# Patient Record
Sex: Female | Born: 1960
Health system: Southern US, Community
[De-identification: ages and names within clinical notes are randomized; demographics above are authoritative.]

## PROBLEM LIST (undated history)

## (undated) DIAGNOSIS — M1990 Unspecified osteoarthritis, unspecified site: Secondary | ICD-10-CM

## (undated) HISTORY — PX: JOINT REPLACEMENT: SHX530

## (undated) HISTORY — PX: COLONOSCOPY: SHX174

## (undated) HISTORY — PX: REPLACEMENT TOTAL KNEE: SUR1224

## (undated) HISTORY — PX: BUNIONECTOMY: SHX129

## (undated) HISTORY — DX: Unspecified osteoarthritis, unspecified site: M19.90

## (undated) HISTORY — PX: REDUCTION MAMMAPLASTY: SUR839

---

## 2018-07-18 DIAGNOSIS — R110 Nausea: Secondary | ICD-10-CM | POA: Diagnosis not present

## 2018-07-18 DIAGNOSIS — Z20828 Contact with and (suspected) exposure to other viral communicable diseases: Secondary | ICD-10-CM | POA: Diagnosis not present

## 2018-07-18 DIAGNOSIS — B349 Viral infection, unspecified: Secondary | ICD-10-CM | POA: Diagnosis not present

## 2018-07-18 DIAGNOSIS — R52 Pain, unspecified: Secondary | ICD-10-CM | POA: Diagnosis not present

## 2018-07-18 DIAGNOSIS — R197 Diarrhea, unspecified: Secondary | ICD-10-CM | POA: Diagnosis not present

## 2018-08-03 DIAGNOSIS — U071 COVID-19: Secondary | ICD-10-CM | POA: Diagnosis not present

## 2018-12-13 DIAGNOSIS — M25562 Pain in left knee: Secondary | ICD-10-CM | POA: Diagnosis not present

## 2019-01-29 ENCOUNTER — Encounter: Payer: Self-pay | Admitting: Osteopathic Medicine

## 2019-01-29 ENCOUNTER — Ambulatory Visit (INDEPENDENT_AMBULATORY_CARE_PROVIDER_SITE_OTHER): Payer: BC Managed Care – PPO | Admitting: Osteopathic Medicine

## 2019-01-29 VITALS — BP 115/74 | HR 76 | Temp 98.2°F | Ht 71.0 in | Wt 216.1 lb

## 2019-01-29 DIAGNOSIS — Z0000 Encounter for general adult medical examination without abnormal findings: Secondary | ICD-10-CM

## 2019-01-29 NOTE — Progress Notes (Signed)
HPI: Sara Leblanc is a 59 y.o. female who  has no past medical history on file.  she presents to [facility] today, 01/29/19,  for chief complaint of: New to establish care.  Very pleasant new patient here to establish care.  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  They are both now retired.  Patient has no significant medical problems, takes a variety of over-the-counter supplements.  Has been sometime since last lab work.  Reports history of right knee replacement, left knee arthritis is becoming an issue but she is already set up with orthopedics.   Reports that she might be due for colonoscopy, mammogram, and Pap smear but she would like for me to review records to double check before she schedules anything.  She reports up-to-date with flu shot, tetanus shot, recent shingles shot and has follow-up for second shot, pneumonia shot done as well.  Past medical, surgical, social and family history reviewed:  There are no problems to display for this patient.   Past Surgical History:  Procedure Laterality Date  . REPLACEMENT TOTAL KNEE Right     Social History   Tobacco Use  . Smoking status: Never Smoker  . Smokeless tobacco: Never Used  Substance Use Topics  . Alcohol use: Not Currently    History reviewed. No pertinent family history.   Current medication list and allergy/intolerance information reviewed:    Current Outpatient Medications  Medication Sig Dispense Refill  . Ascorbic Acid (VITAMIN C) 1000 MG tablet Take 1,000 mg by mouth daily.    . Calcium-Vitamin D (CALTRATE 600 PLUS-VIT D PO) Take by mouth.    . Cholecalciferol (VITAMIN D3) 125 MCG (5000 UT) CAPS Take by mouth.    . Cyanocobalamin (B-12) 1000 MCG CAPS Take by mouth.    . Multiple Vitamins-Minerals (CENTRUM SILVER 50+WOMEN PO) Take by mouth.    . Omega 3-6-9 Fatty  Acids (OMEGA-3-6-9 PO) Take by mouth.    . Zinc 25 MG TABS Take by mouth.     No current facility-administered medications for this visit.    No Known Allergies    Review of Systems:  Constitutional:  No  fever, no chills, No recent illness, No unintentional weight changes. No significant fatigue.   HEENT: No  headache, no vision change, no hearing change, No sore throat, No  sinus pressure  Cardiac: No  chest pain, No  pressure, No palpitations, No  Orthopnea  Respiratory:  No  shortness of breath. No  Cough  Gastrointestinal: No  abdominal pain, No  nausea, No  vomiting,  No  blood in stool, No  diarrhea, No  constipation   Musculoskeletal: No new myalgia/arthralgia  Skin: No  Rash, No other wounds/concerning lesions  Genitourinary: No  incontinence, No  abnormal genital bleeding, No abnormal genital discharge  Hem/Onc: No  easy bruising/bleeding, No  abnormal lymph node  Endocrine: No cold intolerance,  No heat intolerance. No polyuria/polydipsia/polyphagia   Neurologic: No  weakness, No  dizziness, No  slurred speech/focal weakness/facial droop  Psychiatric: No  concerns with depression, No  concerns with anxiety, No sleep problems, No mood problems  Exam:  BP 115/74 (BP Location: Left Arm, Patient Position: Sitting, Cuff Size: Normal)   Pulse 76   Temp 98.2 F (36.8 C) (Oral)   Ht 5\' 11"  (1.803 m)   Wt 216 lb 1.9 oz (98 kg)  BMI 30.14 kg/m   Constitutional: VS see above. General Appearance: alert, well-developed, well-nourished, NAD  Eyes: Normal lids and conjunctive, non-icteric sclera  Neck: No masses, trachea midline. No thyroid enlargement. No tenderness/mass appreciated. No lymphadenopathy  Respiratory: Normal respiratory effort. no wheeze, no rhonchi, no rales  Cardiovascular: S1/S2 normal, no murmur, no rub/gallop auscultated. RRR. No lower extremity edema.   Gastrointestinal: Nontender, no masses. No hepatomegaly, no splenomegaly. No hernia  appreciated. Bowel sounds normal. Rectal exam deferred.   Musculoskeletal: Gait normal. No clubbing/cyanosis of digits.   Neurological: Normal balance/coordination. No tremor. No cranial nerve deficit on limited exam. Motor and sensation intact and symmetric. Cerebellar reflexes intact.   Skin: warm, dry, intact. No rash/ulcer. No concerning nevi or subq nodules on limited exam.    Psychiatric: Normal judgment/insight. Normal mood and affect. Oriented x3.    No results found for this or any previous visit (from the past 72 hour(s)).  No results found.   ASSESSMENT/PLAN: The encounter diagnosis was Encounter for medical examination to establish care.   No complaints  Patient would like me to look over her records before she decides what she might be due for/willing to get soon.  Sounds like she might be due for colonoscopy, may be due for mammogram later this year.  No orders of the defined types were placed in this encounter.   No orders of the defined types were placed in this encounter.   Patient Instructions  I will look over records and call you w/ plan!        Visit summary with medication list and pertinent instructions was printed for patient to review. All questions at time of visit were answered - patient instructed to contact office with any additional concerns or updates. ER/RTC precautions were reviewed with the patient.   Note: Total time spent 30 minutes, greater than 50% of the visit was spent face-to-face counseling and coordinating care for the above diagnoses listed in assessment/plan.   Please note: voice recognition software was used to produce this document, and typos may escape review. Please contact Dr. Lyn Hollingshead for any needed clarifications.     Follow-up plan: Return in about 1 year (around 01/29/2020) for [place] (call week prior to visit for lab orders).

## 2019-01-29 NOTE — Patient Instructions (Signed)
I will look over records and call you w/ plan!

## 2019-03-17 ENCOUNTER — Telehealth: Payer: Self-pay

## 2019-03-17 NOTE — Telephone Encounter (Signed)
Non-urgent msg:  Pt called requesting to verify whether provider has received her medical records from [place]. She wanted to know whether she is due for a mammogram as well. Pls advise, thanks.

## 2019-03-18 NOTE — Telephone Encounter (Signed)
I don't think we received these records - can we request again? Mammogram recommended annually either way if she thinks she's due we can get it done

## 2019-03-19 NOTE — Telephone Encounter (Signed)
LVM for patient to callback with contact information for med recs from [place].  Or patient can contact prior provider directly to have med recs sent.

## 2019-03-19 NOTE — Telephone Encounter (Signed)
Contacted the prior Provider for patient's records.   Provider stated that documentation was incomplete for ROI.   Advised the patient.   Pt coming to the office to complete documentation for ROI on 03/20/19.  I have transferred the patient's information to the new form and highlighted all places for signature.

## 2019-04-01 ENCOUNTER — Telehealth: Payer: Self-pay

## 2019-04-01 DIAGNOSIS — Z1231 Encounter for screening mammogram for malignant neoplasm of breast: Secondary | ICD-10-CM

## 2019-04-01 NOTE — Telephone Encounter (Signed)
Pt left a vm msg stating she believes she is due for her mammogram screening. As per pt, provider should have rec'd her medical records from [place]. Mammo referral pended.

## 2019-04-16 ENCOUNTER — Other Ambulatory Visit: Payer: Self-pay

## 2019-04-16 ENCOUNTER — Ambulatory Visit (INDEPENDENT_AMBULATORY_CARE_PROVIDER_SITE_OTHER): Payer: BC Managed Care – PPO

## 2019-04-16 DIAGNOSIS — Z1231 Encounter for screening mammogram for malignant neoplasm of breast: Secondary | ICD-10-CM

## 2019-04-24 ENCOUNTER — Other Ambulatory Visit: Payer: Self-pay | Admitting: Osteopathic Medicine

## 2019-04-24 DIAGNOSIS — R928 Other abnormal and inconclusive findings on diagnostic imaging of breast: Secondary | ICD-10-CM

## 2019-04-30 ENCOUNTER — Ambulatory Visit
Admission: RE | Admit: 2019-04-30 | Discharge: 2019-04-30 | Disposition: A | Discharge status: Home / Self Care | Payer: BC Managed Care – PPO | Source: Ambulatory Visit | Attending: Osteopathic Medicine | Admitting: Osteopathic Medicine

## 2019-04-30 ENCOUNTER — Other Ambulatory Visit: Payer: Self-pay

## 2019-04-30 ENCOUNTER — Other Ambulatory Visit: Payer: Self-pay | Admitting: Osteopathic Medicine

## 2019-04-30 DIAGNOSIS — R928 Other abnormal and inconclusive findings on diagnostic imaging of breast: Secondary | ICD-10-CM

## 2019-04-30 DIAGNOSIS — R922 Inconclusive mammogram: Secondary | ICD-10-CM | POA: Diagnosis not present

## 2019-04-30 DIAGNOSIS — N6311 Unspecified lump in the right breast, upper outer quadrant: Secondary | ICD-10-CM | POA: Diagnosis not present

## 2019-04-30 DIAGNOSIS — N6489 Other specified disorders of breast: Secondary | ICD-10-CM | POA: Diagnosis not present

## 2019-07-21 DIAGNOSIS — Z96652 Presence of left artificial knee joint: Secondary | ICD-10-CM | POA: Insufficient documentation

## 2019-11-06 ENCOUNTER — Other Ambulatory Visit: Payer: BC Managed Care – PPO

## 2019-11-10 ENCOUNTER — Other Ambulatory Visit: Payer: Self-pay | Admitting: Osteopathic Medicine

## 2019-11-10 ENCOUNTER — Ambulatory Visit
Admission: RE | Admit: 2019-11-10 | Discharge: 2019-11-10 | Disposition: A | Discharge status: Home / Self Care | Payer: BC Managed Care – PPO | Source: Ambulatory Visit | Attending: Osteopathic Medicine | Admitting: Osteopathic Medicine

## 2019-11-10 ENCOUNTER — Other Ambulatory Visit: Payer: Self-pay

## 2019-11-10 DIAGNOSIS — R928 Other abnormal and inconclusive findings on diagnostic imaging of breast: Secondary | ICD-10-CM

## 2019-11-12 ENCOUNTER — Telehealth: Payer: Self-pay

## 2019-11-12 DIAGNOSIS — Z1211 Encounter for screening for malignant neoplasm of colon: Secondary | ICD-10-CM

## 2019-11-12 NOTE — Telephone Encounter (Signed)
Pt called requesting a referral for colonoscopy. Per pt, she is overdue to have procedure completed. Referral pended.

## 2020-02-03 ENCOUNTER — Ambulatory Visit (INDEPENDENT_AMBULATORY_CARE_PROVIDER_SITE_OTHER): Payer: BC Managed Care – PPO | Admitting: Osteopathic Medicine

## 2020-02-03 ENCOUNTER — Encounter: Payer: Self-pay | Admitting: Osteopathic Medicine

## 2020-02-03 ENCOUNTER — Other Ambulatory Visit: Payer: Self-pay

## 2020-02-03 VITALS — BP 112/78 | HR 69 | Wt 216.4 lb

## 2020-02-03 DIAGNOSIS — E559 Vitamin D deficiency, unspecified: Secondary | ICD-10-CM

## 2020-02-03 DIAGNOSIS — Z1211 Encounter for screening for malignant neoplasm of colon: Secondary | ICD-10-CM

## 2020-02-03 DIAGNOSIS — Z8349 Family history of other endocrine, nutritional and metabolic diseases: Secondary | ICD-10-CM

## 2020-02-03 DIAGNOSIS — Z0000 Encounter for general adult medical examination without abnormal findings: Secondary | ICD-10-CM

## 2020-02-03 DIAGNOSIS — Z780 Asymptomatic menopausal state: Secondary | ICD-10-CM

## 2020-02-03 DIAGNOSIS — M170 Bilateral primary osteoarthritis of knee: Secondary | ICD-10-CM | POA: Diagnosis not present

## 2020-02-03 DIAGNOSIS — Z1231 Encounter for screening mammogram for malignant neoplasm of breast: Secondary | ICD-10-CM

## 2020-02-03 NOTE — Patient Instructions (Addendum)
General Preventive Care  Most recent routine screening labs: ordered today.   Blood pressure goal 140/90 or less.   Tobacco: don't!  Alcohol: responsible moderation is ok for most adults - if you have concerns about your alcohol intake, please talk to me!   Exercise: as tolerated to reduce risk of cardiovascular disease and diabetes. Strength training will also prevent osteoporosis.   Mental health: if need for mental health care (medicines, counseling, other), or concerns about moods, please let me know!   Sexual / Reproductive health: if need for STD testing, or if concerns with libido/pain problems, please let me know!  Advanced Directive: Living Will and/or Healthcare Power of Attorney recommended for all adults, regardless of age or health.  Vaccines  Flu vaccine: for almost everyone, every fall.   Shingles vaccine: after age 110. Due for #2 of 2  Pneumonia vaccines: after age 48.  Tetanus booster: every 10 years - due 2028  COVID vaccine: THANKS for getting your vaccine! :)  Cancer screenings   Colon cancer screening: referral placed for colonoscopy   Breast cancer screening: mammogram scheduled 05/11/20  Cervical cancer screening: Pap every 1 to 5 years depending on age and other risk factors. Can usually stop at age 35 or w/ hysterectomy.   Lung cancer screening: not needed for non-smokers  Infection screenings  . HIV: recommended screening at least once age 68-65, more often as needed. . Gonorrhea/Chlamydia: screening as needed . Hepatitis C: recommended once for everyone age 28-75 . TB: certain at-risk populations, or depending on work requirements and/or travel history Other . Bone Density Test: recommended at age 18

## 2020-02-03 NOTE — Progress Notes (Unsigned)
HPI: Sara Leblanc is a 60 y.o. female who  has no past medical history on file.  she presents to [facility] today, 02/03/20,  for chief complaint of:  Annual check up / physical      ASSESSMENT/PLAN: The primary encounter diagnosis was Annual physical exam. Diagnoses of Vitamin D deficiency, Postmenopausal, Primary osteoarthritis of both knees, Encounter for screening mammogram for malignant neoplasm of breast, Colon cancer screening, and Family history of B12 deficiency were also pertinent to this visit.   Orders Placed This Encounter  Procedures  . CBC  . COMPLETE METABOLIC PANEL WITH GFR  . Lipid panel  . Ambulatory referral to Gastroenterology     No orders of the defined types were placed in this encounter.   Patient Instructions  General Preventive Care  Most recent routine screening labs: ordered today.   Blood pressure goal 140/90 or less.   Tobacco: don't!  Alcohol: responsible moderation is ok for most adults - if you have concerns about your alcohol intake, please talk to me!   Exercise: as tolerated to reduce risk of cardiovascular disease and diabetes. Strength training will also prevent osteoporosis.   Mental health: if need for mental health care (medicines, counseling, other), or concerns about moods, please let me know!   Sexual / Reproductive health: if need for STD testing, or if concerns with libido/pain problems, please let me know!  Advanced Directive: Living Will and/or Healthcare Power of Attorney recommended for all adults, regardless of age or health.  Vaccines  Flu vaccine: for almost everyone, every fall.   Shingles vaccine: after age 78. Due for #2 of 2 (will check records, pt believes she had this)   Pneumonia vaccines: after age 71.  Tetanus booster: every 10 years - due 2028  COVID vaccine: THANKS for getting your vaccine! :)  Cancer screenings   Colon cancer screening: referral placed for  colonoscopy   Breast cancer screening: mammogram scheduled 05/11/20  Cervical cancer screening: Pap every 1 to 5 years depending on age and other risk factors. Can usually stop at age 54 or w/ hysterectomy. (will get this next year if we can't obtain records - pt states she usually sees OBGYN annually)   Lung cancer screening: not needed for non-smokers  Infection screenings  . HIV: recommended screening at least once age 74-65, more often as needed. . Gonorrhea/Chlamydia: screening as needed . Hepatitis C: recommended once for everyone age 78-75 . TB: certain at-risk populations, or depending on work requirements and/or travel history Other . Bone Density Test: recommended at age 22     Follow-up plan: Return in about 1 year (around 02/02/2021).                                                 ################################################# ################################################# ################################################# #################################################    Current Meds  Medication Sig  . Ascorbic Acid (VITAMIN C) 1000 MG tablet Take 1,000 mg by mouth daily.  . Calcium-Vitamin D (CALTRATE 600 PLUS-VIT D PO) Take by mouth.  . Cholecalciferol (VITAMIN D3) 125 MCG (5000 UT) CAPS Take by mouth.  . Cyanocobalamin (B-12) 1000 MCG CAPS Take by mouth.  . meloxicam (MOBIC) 15 MG tablet Take 15 mg by mouth daily.  . Multiple Vitamins-Minerals (CENTRUM SILVER 50+WOMEN PO) Take by mouth.  Ailene Ards 3-6-9 Fatty Acids (OMEGA-3-6-9 PO) Take  by mouth.  . Zinc 25 MG TABS Take by mouth.    No Known Allergies     Review of Systems: Pertinent (+) and (-) ROS in HPI as above   Exam:  BP 112/78   Pulse 69   Wt 216 lb 6.4 oz (98.2 kg)   SpO2 94%   BMI 30.18 kg/m   Constitutional: VS see above. General Appearance: alert, well-developed, well-nourished, NAD  Neck: No masses, trachea midline.    Respiratory: Normal respiratory effort. no wheeze, no rhonchi, no rales  Cardiovascular: S1/S2 normal, no murmur, no rub/gallop auscultated. RRR.   Musculoskeletal: Gait normal. Symmetric and independent movement of all extremities  Abdominal: non-tender, non-distended, no appreciable organomegaly, neg Murphy's, BS WNLx4  Neurological: Normal balance/coordination. No tremor.  Skin: warm, dry, intact.   Psychiatric: Normal judgment/insight. Normal mood and affect. Oriented x3.       Visit summary with medication list and pertinent instructions was printed for patient to review, patient was advised to alert [place] if any updates are needed. All questions at time of visit were answered - patient instructed to contact office with any additional concerns. ER/RTC precautions were reviewed with the patient and understanding verbalized.    Please note: voice recognition software was used to produce this document, and typos may escape review. Please contact Dr. Lyn Hollingshead for any needed clarifications.    Follow up plan: Return in about 1 year (around 02/02/2021).

## 2020-02-04 LAB — COMPLETE METABOLIC PANEL WITH GFR
AG Ratio: 1.8 (calc) (ref 1.0–2.5)
ALT: 16 U/L (ref 6–29)
AST: 16 U/L (ref 10–35)
Albumin: 4.5 g/dL (ref 3.6–5.1)
Alkaline phosphatase (APISO): 83 U/L (ref 37–153)
BUN: 16 mg/dL (ref 7–25)
CO2: 27 mmol/L (ref 20–32)
Calcium: 9.4 mg/dL (ref 8.6–10.4)
Chloride: 108 mmol/L (ref 98–110)
Creat: 0.58 mg/dL (ref 0.50–0.99)
GFR, Est African American: 115 mL/min/{1.73_m2} (ref >=60)
GFR, Est Non African American: 99 mL/min/{1.73_m2} (ref >=60)
Globulin: 2.5 g/dL (calc) (ref 1.9–3.7)
Glucose, Bld: 90 mg/dL (ref 65–99)
Potassium: 4.4 mmol/L (ref 3.5–5.3)
Sodium: 144 mmol/L (ref 135–146)
Total Bilirubin: 1 mg/dL (ref 0.2–1.2)
Total Protein: 7 g/dL (ref 6.1–8.1)

## 2020-02-04 LAB — LIPID PANEL
Cholesterol: 193 mg/dL (ref <200)
HDL: 53 mg/dL (ref >=50)
LDL Cholesterol (Calc): 118 mg/dL (calc) — ABNORMAL HIGH
Non-HDL Cholesterol (Calc): 140 mg/dL (calc) — ABNORMAL HIGH (ref <130)
Total CHOL/HDL Ratio: 3.6 (calc) (ref <5.0)
Triglycerides: 108 mg/dL (ref <150)

## 2020-02-04 LAB — CBC
HCT: 44.2 % (ref 35.0–45.0)
Hemoglobin: 14.5 g/dL (ref 11.7–15.5)
MCH: 27.2 pg (ref 27.0–33.0)
MCHC: 32.8 g/dL (ref 32.0–36.0)
MCV: 82.8 fL (ref 80.0–100.0)
MPV: 9.7 fL (ref 7.5–12.5)
Platelets: 224 10*3/uL (ref 140–400)
RBC: 5.34 10*6/uL — ABNORMAL HIGH (ref 3.80–5.10)
RDW: 13.8 % (ref 11.0–15.0)
WBC: 4.8 10*3/uL (ref 3.8–10.8)

## 2020-02-05 DIAGNOSIS — Z780 Asymptomatic menopausal state: Secondary | ICD-10-CM | POA: Insufficient documentation

## 2020-02-05 DIAGNOSIS — Z8349 Family history of other endocrine, nutritional and metabolic diseases: Secondary | ICD-10-CM | POA: Insufficient documentation

## 2020-02-05 DIAGNOSIS — M170 Bilateral primary osteoarthritis of knee: Secondary | ICD-10-CM | POA: Insufficient documentation

## 2020-02-05 DIAGNOSIS — E559 Vitamin D deficiency, unspecified: Secondary | ICD-10-CM | POA: Insufficient documentation

## 2020-02-10 ENCOUNTER — Encounter: Payer: Self-pay | Admitting: Gastroenterology

## 2020-03-29 ENCOUNTER — Ambulatory Visit (AMBULATORY_SURGERY_CENTER): Payer: Self-pay | Admitting: *Deleted

## 2020-03-29 ENCOUNTER — Other Ambulatory Visit: Payer: Self-pay

## 2020-03-29 VITALS — Ht 71.0 in | Wt 208.0 lb

## 2020-03-29 DIAGNOSIS — Z1211 Encounter for screening for malignant neoplasm of colon: Secondary | ICD-10-CM

## 2020-03-29 MED ORDER — PLENVU 140 G PO SOLR: 1.0000 | ORAL | 0 refills | Status: DC

## 2020-03-29 NOTE — Progress Notes (Signed)
No egg or soy allergy known to patient  No issues with past sedation with any surgeries or procedures Patient denies ever being told they had issues or difficulty with intubation  No FH of Malignant Hyperthermia No diet pills per patient No home 02 use per patient  No blood thinners per patient  Pt denies issues with constipation  No A fib or A flutter  EMMI video to pt or via MyChart  COVID 19 guidelines implemented in PV today with Pt and RN  Pt is fully vaccinated  for Covid   Plenvu Coupon given to pt in PV today , Code to Pharmacy and  NO PA's for preps discussed with pt In PV today  Discussed with pt there will be an out-of-pocket cost for prep and that varies from $0 to 70 dollars   Due to the COVID-19 pandemic we are asking patients to follow certain guidelines.  Pt aware of COVID protocols and [facility] guidelines   

## 2020-04-08 ENCOUNTER — Ambulatory Visit (AMBULATORY_SURGERY_CENTER): Payer: BC Managed Care – PPO | Admitting: Gastroenterology

## 2020-04-08 ENCOUNTER — Encounter: Payer: Self-pay | Admitting: Gastroenterology

## 2020-04-08 ENCOUNTER — Other Ambulatory Visit: Payer: Self-pay

## 2020-04-08 VITALS — BP 115/67 | HR 74 | Temp 97.5°F | Resp 17 | Ht 71.0 in | Wt 208.0 lb

## 2020-04-08 DIAGNOSIS — Z1211 Encounter for screening for malignant neoplasm of colon: Secondary | ICD-10-CM | POA: Diagnosis not present

## 2020-04-08 NOTE — Op Note (Addendum)
[facility] Patient Name: Sara Leblanc Procedure Date: 04/08/2020 12:05 PM MRN: [id number] Endoscopist: Sherilyn Cooter L. Myrtie Neither , MD Age: 60 Referring MD:  Date of Birth: December 27, 1960 Gender: Female Account #: [id number] Procedure:                Colonoscopy Indications:              Screening for colorectal malignant neoplasm                            (patient reported no polyps on screening                            colonoscopy 10 years prior) Medicines:                Monitored Anesthesia Care Procedure:                Pre-Anesthesia Assessment:                           - Prior to the procedure, a History and Physical                            was performed, and patient medications and                            allergies were reviewed. The patient's tolerance of                            previous anesthesia was also reviewed. The risks                            and benefits of the procedure and the sedation                            options and risks were discussed with the patient.                            All questions were answered, and informed consent                            was obtained. Prior Anticoagulants: The patient has                            taken no previous anticoagulant or antiplatelet                            agents. ASA Grade Assessment: I - A normal, healthy                            patient. After reviewing the risks and benefits,                            the patient was deemed in satisfactory condition to  undergo the procedure.                           After obtaining informed consent, the colonoscope                            was passed under direct vision. Throughout the                            procedure, the patient's blood pressure, pulse, and                            oxygen saturations were monitored continuously. The                            Olympus CF-HQ190L (Serial# [id number]) Colonoscope was                             introduced through the anus and advanced to the the                            cecum, identified by appendiceal orifice and                            ileocecal valve. The colonoscopy was performed                            without difficulty. The patient tolerated the                            procedure well. The quality of the bowel                            preparation was excellent. The ileocecal valve,                            appendiceal orifice, and rectum were photographed.                            The bowel preparation used was Plenvu. Scope In: 12:16:24 PM Scope Out: 12:31:01 PM Scope Withdrawal Time: 0 hours 11 minutes 22 seconds  Total Procedure Duration: 0 hours 14 minutes 37 seconds  Findings:                 The perianal and digital rectal examinations were                            normal.                           Multiple diverticula were found in the left colon.                           There is no endoscopic evidence of polyps in the  entire colon.                           The exam was otherwise without abnormality on                            direct and retroflexion views. Complications:            No immediate complications. Estimated Blood Loss:     Estimated blood loss: none. Impression:               - Diverticulosis in the left colon.                           - The examination was otherwise normal on direct                            and retroflexion views.                           - No specimens collected. Recommendation:           - Patient has a contact number available for                            emergencies. The signs and symptoms of potential                            delayed complications were discussed with the                            patient. Return to normal activities tomorrow.                            Written discharge instructions were provided to the                             patient.                           - Resume previous diet.                           - Continue present medications.                           - Repeat colonoscopy in 10 years for screening                            purposes. Jaleigh Mccroskey L. Myrtie Neither, MD 04/08/2020 12:35:13 PM This report has been signed electronically.

## 2020-04-08 NOTE — Progress Notes (Signed)
Chace Klippel CRNA relieves Wall CRNA 

## 2020-04-08 NOTE — Progress Notes (Signed)
A/ox3, pleased with MAC, report to RN 

## 2020-04-08 NOTE — Progress Notes (Signed)
VS by CW  I have reviewed the patient's medical history in detail and updated the computerized patient record.  

## 2020-04-08 NOTE — Patient Instructions (Signed)
Discharge instructions given. Handout on Diverticulosis. Resume previous medications. YOU HAD AN ENDOSCOPIC PROCEDURE TODAY AT THE [facility]:   Refer to the procedure report that was given to you for any specific questions about what was found during the examination.  If the procedure report does not answer your questions, please call your gastroenterologist to clarify.  If you requested that your care partner not be given the details of your procedure findings, then the procedure report has been included in a sealed envelope for you to review at your convenience later.  YOU SHOULD EXPECT: Some feelings of bloating in the abdomen. Passage of more gas than usual.  Walking can help get rid of the air that was put into your GI tract during the procedure and reduce the bloating. If you had a lower endoscopy (such as a colonoscopy or flexible sigmoidoscopy) you may notice spotting of blood in your stool or on the toilet paper. If you underwent a bowel prep for your procedure, you may not have a normal bowel movement for a few days.  Please Note:  You might notice some irritation and congestion in your nose or some drainage.  This is from the oxygen used during your procedure.  There is no need for concern and it should clear up in a day or so.  SYMPTOMS TO REPORT IMMEDIATELY:  Following lower endoscopy (colonoscopy or flexible sigmoidoscopy):  Excessive amounts of blood in the stool  Significant tenderness or worsening of abdominal pains  Swelling of the abdomen that is new, acute  Fever of 100F or higher   For urgent or emergent issues, a gastroenterologist can be reached at any hour by calling [phone]. Do not use MyChart messaging for urgent concerns.    DIET:  We do recommend a small meal at first, but then you may proceed to your regular diet.  Drink plenty of fluids but you should avoid alcoholic beverages for 24 hours.  ACTIVITY:  You should plan to take it easy for  the rest of today and you should NOT DRIVE or use heavy machinery until tomorrow (because of the sedation medicines used during the test).    FOLLOW UP: Our staff will call the number listed on your records 48-72 hours following your procedure to check on you and address any questions or concerns that you may have regarding the information given to you following your procedure. If we do not reach you, we will leave a message.  We will attempt to reach you two times.  During this call, we will ask if you have developed any symptoms of COVID 19. If you develop any symptoms (ie: fever, flu-like symptoms, shortness of breath, cough etc.) before then, please call [phone].  If you test positive for Covid 19 in the 2 weeks post procedure, please call and report this information to us.    If any biopsies were taken you will be contacted by phone or by letter within the next 1-3 weeks.  Please call us at [phone] if you have not heard about the biopsies in 3 weeks.    SIGNATURES/CONFIDENTIALITY: You and/or your care partner have signed paperwork which will be entered into your electronic medical record.  These signatures attest to the fact that that the information above on your After Visit Summary has been reviewed and is understood.  Full responsibility of the confidentiality of this discharge information lies with you and/or your care-partner.  

## 2020-04-12 ENCOUNTER — Telehealth: Payer: Self-pay | Admitting: *Deleted

## 2020-04-12 NOTE — Telephone Encounter (Signed)
  Follow up Call-  Call back number 04/08/2020  Post procedure Call Back phone  # [phone]  Permission to leave phone message Yes     Patient questions:  Do you have a fever, pain , or abdominal swelling? No. Pain Score  0 *  Have you tolerated food without any problems? Yes.    Have you been able to return to your normal activities? Yes.    Do you have any questions about your discharge instructions: Diet   No. Medications  No. Follow up visit  No.  Do you have questions or concerns about your Care? No.  Actions: * If pain score is 4 or above: 1. No action needed, pain <4.Have you developed a fever since your procedure? no  2.   Have you had an respiratory symptoms (SOB or cough) since your procedure? no  3.   Have you tested positive for COVID 19 since your procedure no  4.   Have you had any family members/close contacts diagnosed with the COVID 19 since your procedure?  no   If yes to any of these questions please route to Laverna Peace, RN and Karlton Lemon, RN

## 2020-05-11 ENCOUNTER — Ambulatory Visit
Admission: RE | Admit: 2020-05-11 | Discharge: 2020-05-11 | Disposition: A | Discharge status: Home / Self Care | Payer: BC Managed Care – PPO | Source: Ambulatory Visit | Attending: Osteopathic Medicine | Admitting: Osteopathic Medicine

## 2020-05-11 ENCOUNTER — Other Ambulatory Visit: Payer: Self-pay

## 2020-05-11 DIAGNOSIS — R928 Other abnormal and inconclusive findings on diagnostic imaging of breast: Secondary | ICD-10-CM

## 2021-02-03 ENCOUNTER — Other Ambulatory Visit: Payer: Self-pay

## 2021-02-03 ENCOUNTER — Ambulatory Visit (INDEPENDENT_AMBULATORY_CARE_PROVIDER_SITE_OTHER): Payer: BC Managed Care – PPO | Admitting: Medical-Surgical

## 2021-02-03 ENCOUNTER — Encounter: Payer: BC Managed Care – PPO | Admitting: Osteopathic Medicine

## 2021-02-03 ENCOUNTER — Encounter: Payer: Self-pay | Admitting: Medical-Surgical

## 2021-02-03 VITALS — BP 104/68 | HR 73 | Resp 20 | Ht 71.0 in | Wt 204.0 lb

## 2021-02-03 DIAGNOSIS — Z7689 Persons encountering health services in other specified circumstances: Secondary | ICD-10-CM

## 2021-02-03 DIAGNOSIS — Z8349 Family history of other endocrine, nutritional and metabolic diseases: Secondary | ICD-10-CM

## 2021-02-03 DIAGNOSIS — Z1329 Encounter for screening for other suspected endocrine disorder: Secondary | ICD-10-CM

## 2021-02-03 DIAGNOSIS — E559 Vitamin D deficiency, unspecified: Secondary | ICD-10-CM

## 2021-02-03 DIAGNOSIS — M2550 Pain in unspecified joint: Secondary | ICD-10-CM

## 2021-02-03 DIAGNOSIS — Z131 Encounter for screening for diabetes mellitus: Secondary | ICD-10-CM

## 2021-02-03 DIAGNOSIS — Z0000 Encounter for general adult medical examination without abnormal findings: Secondary | ICD-10-CM

## 2021-02-03 DIAGNOSIS — M7989 Other specified soft tissue disorders: Secondary | ICD-10-CM

## 2021-02-03 DIAGNOSIS — R5383 Other fatigue: Secondary | ICD-10-CM

## 2021-02-03 NOTE — Progress Notes (Signed)
HPI: Sara Leblanc is a 61 y.o. female who  has a past medical history of Arthritis.  she presents to [facility] today, 02/03/21,  for chief complaint of: Annual physical exam  Dentist: UTD, every 6 months, no concerns Eye exam: Last 4 years ago, no current vision insurance, no correction Exercise: None intentional although she does have a new puppy, plans to join the fitness center in [place]: No restrictions Pap smear: Overdue, will evaluate records to see when this was to and she is welcome to come in at her convenience Mammogram: Done last year, up-to-date Colon cancer screening: Up-to-date COVID vaccine: Done, no booster  Concerns: Right axillary swelling  Past medical, surgical, social and family history reviewed:  Patient Active Problem List   Diagnosis Date Noted   Vitamin D deficiency 02/05/2020   Postmenopausal 02/05/2020   Primary osteoarthritis of both knees 02/05/2020   Family history of B12 deficiency 02/05/2020   Status post left knee replacement 07/21/2019    Past Surgical History:  Procedure Laterality Date   BUNIONECTOMY     COLONOSCOPY     REDUCTION MAMMAPLASTY     REPLACEMENT TOTAL KNEE Bilateral    right 2012, left 07-2019    Social History   Tobacco Use   Smoking status: Never   Smokeless tobacco: Never  Substance Use Topics   Alcohol use: Yes    Comment: social     Family History  Problem Relation Age of Onset   Colon cancer Neg Hx    Colon polyps Neg Hx    Esophageal cancer Neg Hx    Rectal cancer Neg Hx    Stomach cancer Neg Hx      Current medication list and allergy/intolerance information reviewed:    Current Outpatient Medications  Medication Sig Dispense Refill   Ascorbic Acid (VITAMIN C) 1000 MG tablet Take 1,000 mg by mouth daily.     Calcium-Vitamin D (CALTRATE 600 PLUS-VIT D PO) Take by mouth.     Cholecalciferol (VITAMIN D3) 125 MCG (5000 UT) CAPS Take by mouth.      Cyanocobalamin (B-12) 1000 MCG CAPS Take by mouth.     meloxicam (MOBIC) 15 MG tablet Take 15 mg by mouth daily.     Multiple Vitamins-Minerals (CENTRUM SILVER 50+WOMEN PO) Take by mouth.     Omega 3-6-9 Fatty Acids (OMEGA-3-6-9 PO) Take by mouth.     Zinc 25 MG TABS Take by mouth.     No current facility-administered medications for this visit.    No Known Allergies    Review of Systems: Constitutional:  No  fever, no chills, No recent illness, No unintentional weight changes.  + Significant fatigue.  HEENT: No  headache, no vision change, no hearing change, No sore throat, No  sinus pressure Cardiac: No  chest pain, No  pressure, No palpitations, No  Orthopnea Respiratory:  No  shortness of breath. No  Cough Gastrointestinal: No  abdominal pain, No  nausea, No  vomiting,  No  blood in stool, No  diarrhea, No  constipation  Musculoskeletal: No new myalgia/arthralgia, chronic lower extremity arthralgias Skin: No  Rash, No other wounds/concerning lesions Genitourinary: No  incontinence, No  abnormal genital bleeding, No abnormal genital discharge Hem/Onc: No  easy bruising/bleeding, No  abnormal lymph node Endocrine: No cold intolerance,  No heat intolerance. No polyuria/polydipsia/polyphagia  Neurologic: No  weakness, No  dizziness, No  slurred speech/focal weakness/facial droop Psychiatric: No  concerns with depression, No  concerns  with anxiety, No sleep problems, No mood problems  Exam:  BP 104/68 (BP Location: Left Arm, Patient Position: Sitting, Cuff Size: Large)    Pulse 73    Resp 20    Ht 5' 11" (1.803 m)    Wt 204 lb (92.5 kg)    SpO2 96%    BMI 28.45 kg/m  Constitutional: VS see above. General Appearance: alert, well-developed, well-nourished, NAD Eyes: Normal lids and conjunctive, non-icteric sclera Ears, Nose, Mouth, Throat: MMM, Normal external inspection ears/nares/mouth/lips/gums. TM normal bilaterally.   Neck: No masses, trachea midline. No thyroid enlargement. No  tenderness/mass appreciated. No lymphadenopathy Respiratory: Normal respiratory effort. no wheeze, no rhonchi, no rales Cardiovascular: S1/S2 normal, no murmur, no rub/gallop auscultated. RRR. No lower extremity edema. Pedal pulse II/IV bilaterally PT. No carotid bruit or JVD. No abdominal aortic bruit. Gastrointestinal: Nontender, no masses. No hepatomegaly, no splenomegaly. No hernia appreciated. Bowel sounds normal. Rectal exam deferred.  Musculoskeletal: Gait normal. No clubbing/cyanosis of digits.  Neurological: Normal balance/coordination. No tremor. No cranial nerve deficit on limited exam. Motor and sensation intact and symmetric. Cerebellar reflexes intact.  Skin: warm, dry, intact. No rash/ulcer. No concerning nevi or subq nodules on limited exam.  Soft enlarged area to the right axilla with no palpable margins, no fluctuance/erythema/tenderness. Psychiatric: Normal judgment/insight. Normal mood and affect. Oriented x3.    ASSESSMENT/PLAN:   1. Encounter to establish care Reviewed available information and discussed care concerns with patient.   2. Annual physical exam Checking labs as below.  Wellness information provided with AVS.  She is overdue for Pap smear but we are not sure when her last one was.  We will review available records.  May be able to postpone this until next year. - Lipid panel - COMPLETE METABOLIC PANEL WITH GFR - CBC with Differential/Platelet  3. Vitamin D deficiency Checking vitamin D. - VITAMIN D 25 Hydroxy (Vit-D Deficiency, Fractures)  4. Family history of B12 deficiency Checking vitamin B12 - Vitamin B12  5. Thyroid disorder screen Checking TSH. - TSH  6. Diabetes mellitus screening Checking hemoglobin A1c. - Hemoglobin A1c  7. Fatigue, unspecified type 8. Arthralgia, unspecified joint Checking labs as above.  Also adding an ESR and CRP for further evaluation. - Sed Rate (ESR) - C-reactive protein  9. Right axillary swelling Suspect  this is a lipoma but patient would like to make sure that there is nothing else going on given her history of the surgical procedure on the left axilla.  Ordering ultrasound for further evaluation. - [place] AXILLA RIGHT; Future  Orders Placed This Encounter  Procedures   [place] AXILLA RIGHT   TSH   Lipid panel   COMPLETE METABOLIC PANEL WITH GFR   CBC with Differential/Platelet   Hemoglobin A1c   VITAMIN D 25 Hydroxy (Vit-D Deficiency, Fractures)   Vitamin B12   Sed Rate (ESR)   C-reactive protein    No orders of the defined types were placed in this encounter.   Patient Instructions  Preventive Care 80-6 Years Old, Female Preventive care refers to lifestyle choices and visits with your health care provider that can promote health and wellness. Preventive care visits are also called wellness exams. What can I expect for my preventive care visit? Counseling Your health care provider may ask you questions about your: Medical history, including: Past medical problems. Family medical history. Pregnancy history. Current health, including: Menstrual cycle. Method of birth control. Emotional well-being. Home life and relationship well-being. Sexual activity and sexual health. Lifestyle, including:  Alcohol, nicotine or tobacco, and drug use. Access to firearms. Diet, exercise, and sleep habits. Work and work Statistician. Sunscreen use. Safety issues such as seatbelt and bike helmet use. Physical exam Your health care provider will check your: Height and weight. These may be used to calculate your BMI (body mass index). BMI is a measurement that tells if you are at a healthy weight. Waist circumference. This measures the distance around your waistline. This measurement also tells if you are at a healthy weight and may help predict your risk of certain diseases, such as type 2 diabetes and high blood pressure. Heart rate and blood pressure. Body temperature. Skin for abnormal  spots. What immunizations do I need? Vaccines are usually given at various ages, according to a schedule. Your health care provider will recommend vaccines for you based on your age, medical history, and lifestyle or other factors, such as travel or where you work. What tests do I need? Screening Your health care provider may recommend screening tests for certain conditions. This may include: Lipid and cholesterol levels. Diabetes screening. This is done by checking your blood sugar (glucose) after you have not eaten for a while (fasting). Pelvic exam and Pap test. Hepatitis B test. Hepatitis C test. HIV (human immunodeficiency virus) test. STI (sexually transmitted infection) testing, if you are at risk. Lung cancer screening. Colorectal cancer screening. Mammogram. Talk with your health care provider about when you should start having regular mammograms. This may depend on whether you have a family history of breast cancer. BRCA-related cancer screening. This may be done if you have a family history of breast, ovarian, tubal, or peritoneal cancers. Bone density scan. This is done to screen for osteoporosis. Talk with your health care provider about your test results, treatment options, and if necessary, the need for more tests. Follow these instructions at home: Eating and drinking  Eat a diet that includes fresh fruits and vegetables, whole grains, lean protein, and low-fat dairy products. Take vitamin and mineral supplements as recommended by your health care provider. Do not drink alcohol if: Your health care provider tells you not to drink. You are pregnant, may be pregnant, or are planning to become pregnant. If you drink alcohol: Limit how much you have to 0-1 drink a day. Know how much alcohol is in your drink. In the [place] one drink equals one 12 oz bottle of beer (355 mL), one 5 oz glass of wine (148 mL), or one 1 oz glass of hard liquor (44 mL). Lifestyle Brush your teeth  every morning and night with fluoride toothpaste. Floss one time each day. Exercise for at least 30 minutes 5 or more days each week. Do not use any products that contain nicotine or tobacco. These products include cigarettes, chewing tobacco, and vaping devices, such as e-cigarettes. If you need help quitting, ask your health care provider. Do not use drugs. If you are sexually active, practice safe sex. Use a condom or other form of protection to prevent STIs. If you do not wish to become pregnant, use a form of birth control. If you plan to become pregnant, see your health care provider for a prepregnancy visit. Take aspirin only as told by your health care provider. Make sure that you understand how much to take and what form to take. Work with your health care provider to find out whether it is safe and beneficial for you to take aspirin daily. Find healthy ways to manage stress, such as: Meditation, yoga, or  listening to music. Journaling. Talking to a trusted person. Spending time with friends and family. Minimize exposure to UV radiation to reduce your risk of skin cancer. Safety Always wear your seat belt while driving or riding in a vehicle. Do not drive: If you have been drinking alcohol. Do not ride with someone who has been drinking. When you are tired or distracted. While texting. If you have been using any mind-altering substances or drugs. Wear a helmet and other protective equipment during sports activities. If you have firearms in your house, make sure you follow all gun safety procedures. Seek help if you have been physically or sexually abused. What's next? Visit your health care provider once a year for an annual wellness visit. Ask your health care provider how often you should have your eyes and teeth checked. Stay up to date on all vaccines. This information is not intended to replace advice given to you by your health care provider. Make sure you discuss any  questions you have with your health care provider. Document Revised: 06/23/2020 Document Reviewed: 06/23/2020 Elsevier Patient Education  [place].  Follow-up plan: Return in about 1 year (around 02/03/2022) for annual physical exam; pap smear at your convenience.  Clearnce Sorrel, DNP, APRN, FNP-BC [facility]

## 2021-02-03 NOTE — Patient Instructions (Addendum)

## 2021-02-04 LAB — CBC WITH DIFFERENTIAL/PLATELET
Absolute Monocytes: 294 cells/uL (ref 200–950)
Basophils Absolute: 41 cells/uL (ref 0–200)
Basophils Relative: 0.9 %
Eosinophils Absolute: 92 cells/uL (ref 15–500)
Eosinophils Relative: 2 %
HCT: 43.1 % (ref 35.0–45.0)
Hemoglobin: 14.2 g/dL (ref 11.7–15.5)
Lymphs Abs: 1564 cells/uL (ref 850–3900)
MCH: 27.3 pg (ref 27.0–33.0)
MCHC: 32.9 g/dL (ref 32.0–36.0)
MCV: 82.9 fL (ref 80.0–100.0)
MPV: 10.3 fL (ref 7.5–12.5)
Monocytes Relative: 6.4 %
Neutro Abs: 2608 cells/uL (ref 1500–7800)
Neutrophils Relative %: 56.7 %
Platelets: 224 10*3/uL (ref 140–400)
RBC: 5.2 10*6/uL — ABNORMAL HIGH (ref 3.80–5.10)
RDW: 13.5 % (ref 11.0–15.0)
Total Lymphocyte: 34 %
WBC: 4.6 10*3/uL (ref 3.8–10.8)

## 2021-02-04 LAB — HEMOGLOBIN A1C
Hgb A1c MFr Bld: 5.5 % of total Hgb (ref <5.7)
Mean Plasma Glucose: 111 mg/dL
eAG (mmol/L): 6.2 mmol/L

## 2021-02-04 LAB — C-REACTIVE PROTEIN: CRP: 2.3 mg/L (ref <8.0)

## 2021-02-04 LAB — COMPLETE METABOLIC PANEL WITH GFR
AG Ratio: 1.9 (calc) (ref 1.0–2.5)
ALT: 16 U/L (ref 6–29)
AST: 17 U/L (ref 10–35)
Albumin: 4.3 g/dL (ref 3.6–5.1)
Alkaline phosphatase (APISO): 81 U/L (ref 37–153)
BUN: 14 mg/dL (ref 7–25)
CO2: 29 mmol/L (ref 20–32)
Calcium: 9.5 mg/dL (ref 8.6–10.4)
Chloride: 105 mmol/L (ref 98–110)
Creat: 0.65 mg/dL (ref 0.50–1.05)
Globulin: 2.3 g/dL (calc) (ref 1.9–3.7)
Glucose, Bld: 83 mg/dL (ref 65–99)
Potassium: 4.5 mmol/L (ref 3.5–5.3)
Sodium: 142 mmol/L (ref 135–146)
Total Bilirubin: 1.3 mg/dL — ABNORMAL HIGH (ref 0.2–1.2)
Total Protein: 6.6 g/dL (ref 6.1–8.1)
eGFR: 99 mL/min/{1.73_m2} (ref >=60)

## 2021-02-04 LAB — LIPID PANEL
Cholesterol: 194 mg/dL (ref <200)
HDL: 52 mg/dL (ref >=50)
LDL Cholesterol (Calc): 120 mg/dL (calc) — ABNORMAL HIGH
Non-HDL Cholesterol (Calc): 142 mg/dL (calc) — ABNORMAL HIGH (ref <130)
Total CHOL/HDL Ratio: 3.7 (calc) (ref <5.0)
Triglycerides: 111 mg/dL (ref <150)

## 2021-02-04 LAB — TSH: TSH: 1.72 mIU/L (ref 0.40–4.50)

## 2021-02-04 LAB — SEDIMENTATION RATE: Sed Rate: 2 mm/h (ref 0–30)

## 2021-02-04 LAB — VITAMIN B12: Vitamin B-12: 330 pg/mL (ref 200–1100)

## 2021-02-04 LAB — VITAMIN D 25 HYDROXY (VIT D DEFICIENCY, FRACTURES): Vit D, 25-Hydroxy: 21 ng/mL — ABNORMAL LOW (ref 30–100)

## 2021-02-15 ENCOUNTER — Other Ambulatory Visit: Payer: Self-pay | Admitting: Medical-Surgical

## 2021-02-15 DIAGNOSIS — M7989 Other specified soft tissue disorders: Secondary | ICD-10-CM

## 2021-03-09 ENCOUNTER — Ambulatory Visit
Admission: RE | Admit: 2021-03-09 | Discharge: 2021-03-09 | Disposition: A | Discharge status: Home / Self Care | Payer: BC Managed Care – PPO | Source: Ambulatory Visit | Attending: Medical-Surgical | Admitting: Medical-Surgical

## 2021-03-09 ENCOUNTER — Other Ambulatory Visit: Payer: Self-pay | Admitting: Medical-Surgical

## 2021-03-09 DIAGNOSIS — M7989 Other specified soft tissue disorders: Secondary | ICD-10-CM

## 2021-03-09 DIAGNOSIS — N6310 Unspecified lump in the right breast, unspecified quadrant: Secondary | ICD-10-CM

## 2021-03-11 ENCOUNTER — Other Ambulatory Visit: Payer: Self-pay

## 2021-03-11 DIAGNOSIS — Z780 Asymptomatic menopausal state: Secondary | ICD-10-CM

## 2021-03-23 ENCOUNTER — Ambulatory Visit (INDEPENDENT_AMBULATORY_CARE_PROVIDER_SITE_OTHER): Payer: BC Managed Care – PPO

## 2021-03-23 ENCOUNTER — Telehealth: Payer: Self-pay

## 2021-03-23 ENCOUNTER — Other Ambulatory Visit: Payer: Self-pay

## 2021-03-23 DIAGNOSIS — Z780 Asymptomatic menopausal state: Secondary | ICD-10-CM

## 2021-03-23 NOTE — Telephone Encounter (Signed)
Pt called asking about DEXA results.  Pt advised of Sara Leblanc's comments and recommendations.  Pt expressed understanding.  Tiajuana Amass, CMA ?

## 2021-05-12 NOTE — Progress Notes (Deleted)
    Referring-Joy Larinda Buttery, NP Reason for referral-hyperlipidemia and family history of coronary disease  HPI: 61 year old female for evaluation of hyperlipidemia and family history of coronary artery disease at request of Christen Butter, NP.  Lipid panel January 2023 showed total cholesterol 194, LDL 120, normal TSH, creatinine 0.65, normal liver functions.  Current Outpatient Medications  Medication Sig Dispense Refill   Ascorbic Acid (VITAMIN C) 1000 MG tablet Take 1,000 mg by mouth daily.     Calcium-Vitamin D (CALTRATE 600 PLUS-VIT D PO) Take by mouth.     Cholecalciferol (VITAMIN D3) 125 MCG (5000 UT) CAPS Take by mouth.     Cyanocobalamin (B-12) 1000 MCG CAPS Take by mouth.     meloxicam (MOBIC) 15 MG tablet Take 15 mg by mouth daily.     Multiple Vitamins-Minerals (CENTRUM SILVER 50+WOMEN PO) Take by mouth.     Omega 3-6-9 Fatty Acids (OMEGA-3-6-9 PO) Take by mouth.     Zinc 25 MG TABS Take by mouth.     No current facility-administered medications for this visit.    No Known Allergies   Past Medical History:  Diagnosis Date   Arthritis     Past Surgical History:  Procedure Laterality Date   BUNIONECTOMY     COLONOSCOPY     REDUCTION MAMMAPLASTY     REPLACEMENT TOTAL KNEE Bilateral    right 2012, left 07-2019    Social History   Socioeconomic History   Marital status: Married    Spouse name: Not on file   Number of children: Not on file   Years of education: Not on file   Highest education level: Not on file  Occupational History   Not on file  Tobacco Use   Smoking status: Never   Smokeless tobacco: Never  Vaping Use   Vaping Use: Never used  Substance and Sexual Activity   Alcohol use: Yes    Comment: social    Drug use: Never   Sexual activity: Yes    Partners: Male  Other Topics Concern   Not on file  Social History Narrative   Not on file   Social Determinants of Health   Financial Resource Strain: Not on file  Food Insecurity: Not on file   Transportation Needs: Not on file  Physical Activity: Not on file  Stress: Not on file  Social Connections: Not on file  Intimate Partner Violence: Not on file    Family History  Problem Relation Age of Onset   Colon cancer Neg Hx    Colon polyps Neg Hx    Esophageal cancer Neg Hx    Rectal cancer Neg Hx    Stomach cancer Neg Hx     ROS: no fevers or chills, productive cough, hemoptysis, dysphasia, odynophagia, melena, hematochezia, dysuria, hematuria, rash, seizure activity, orthopnea, PND, pedal edema, claudication. Remaining systems are negative.  Physical Exam:   There were no vitals taken for this visit.  General:  Well developed/well nourished in NAD Skin warm/dry Patient not depressed No peripheral clubbing Back-normal HEENT-normal/normal eyelids Neck supple/normal carotid upstroke bilaterally; no bruits; no JVD; no thyromegaly chest - CTA/ normal expansion CV - RRR/normal S1 and S2; no murmurs, rubs or gallops;  PMI nondisplaced Abdomen -NT/ND, no HSM, no mass, + bowel sounds, no bruit 2+ femoral pulses, no bruits Ext-no edema, chords, 2+ DP Neuro-grossly nonfocal  ECG - personally reviewed  A/P  1 hyperlipidemia-  2 family history of coronary artery disease-  [name], MD

## 2021-05-16 ENCOUNTER — Ambulatory Visit
Admission: RE | Admit: 2021-05-16 | Discharge: 2021-05-16 | Disposition: A | Discharge status: Home / Self Care | Payer: BC Managed Care – PPO | Source: Ambulatory Visit | Attending: Medical-Surgical | Admitting: Medical-Surgical

## 2021-05-16 DIAGNOSIS — N6310 Unspecified lump in the right breast, unspecified quadrant: Secondary | ICD-10-CM

## 2021-05-25 ENCOUNTER — Ambulatory Visit: Payer: BC Managed Care – PPO | Admitting: Cardiology

## 2021-06-13 ENCOUNTER — Telehealth: Payer: Self-pay

## 2021-06-13 ENCOUNTER — Other Ambulatory Visit: Payer: Self-pay | Admitting: Medical-Surgical

## 2021-06-13 MED ORDER — AZITHROMYCIN 250 MG PO TABS: 500.0000 mg | ORAL_TABLET | Freq: Every day | ORAL | 0 refills | Status: AC

## 2021-06-13 MED ORDER — SCOPOLAMINE 1 MG/3DAYS TD PT72: 1.0000 | MEDICATED_PATCH | TRANSDERMAL | 0 refills | Status: DC

## 2021-06-13 NOTE — Telephone Encounter (Signed)
A prescription for azithromycin was sent to the pharmacy with instructions to take 500 mg daily x3 days.  I would recommend that she take this if diarrhea begins rather than taking it before end.  I have also sent a prescription in for scopolamine patches to use for motion sickness.  She should apply 1 behind the ear and leave in place for 72 hours before removing and replacing.  When replacing the patch, she will need to alternate the location to the other side.  Thanks, Caryl Asp

## 2021-06-13 NOTE — Telephone Encounter (Signed)
Pt called stating that she will be traveling to [place] and is requesting a medication in the event that she develops diarrhea there.  She also would  like an RX for motion sickness.  Pt states that she usually develops diarrhea when visiting [place].  Please advise.  Tiajuana Amass, CMA

## 2021-06-13 NOTE — Telephone Encounter (Signed)
Pt informed of RXs and instructions for use.  Pt expressed understanding.  Charyl Bigger, CMA

## 2021-12-09 ENCOUNTER — Ambulatory Visit: Payer: BC Managed Care – PPO | Admitting: Sports Medicine

## 2021-12-09 ENCOUNTER — Encounter: Payer: Self-pay | Admitting: Sports Medicine

## 2021-12-09 VITALS — BP 111/79 | HR 70 | Wt 204.0 lb

## 2021-12-09 DIAGNOSIS — R350 Frequency of micturition: Secondary | ICD-10-CM

## 2021-12-09 DIAGNOSIS — N3000 Acute cystitis without hematuria: Secondary | ICD-10-CM | POA: Insufficient documentation

## 2021-12-09 DIAGNOSIS — N3001 Acute cystitis with hematuria: Secondary | ICD-10-CM | POA: Diagnosis not present

## 2021-12-09 LAB — POCT URINALYSIS DIP (CLINITEK)
Bilirubin, UA: NEGATIVE
Glucose, UA: NEGATIVE mg/dL
Ketones, POC UA: NEGATIVE mg/dL
Nitrite, UA: POSITIVE — AB
POC PROTEIN,UA: 100 — AB
Spec Grav, UA: >=1.03 — AB (ref 1.010–1.025)
Urobilinogen, UA: 0.2 E.U./dL
pH, UA: 5.5 (ref 5.0–8.0)

## 2021-12-09 MED ORDER — PHENAZOPYRIDINE HCL 200 MG PO TABS: 200.0000 mg | ORAL_TABLET | Freq: Three times a day (TID) | ORAL | 0 refills | Status: AC

## 2021-12-09 MED ORDER — NITROFURANTOIN MONOHYD MACRO 100 MG PO CAPS: 100.0000 mg | ORAL_CAPSULE | Freq: Two times a day (BID) | ORAL | 0 refills | Status: DC

## 2021-12-09 NOTE — Progress Notes (Signed)
    Procedures performed today:    None.  Independent interpretation of notes and tests performed by another provider:   None.  Brief History, Exam, Impression, and Recommendations:    Acute cystitis Pleasant 61 year old female, increased urinary urgency, frequency, urinalysis positive for nitrites, leukocytes. No fevers, no costovertebral angle pain, she rarely ever gets urinary tract infection so this is uncomplicated. Adding Macrobid, Pyridium, return to see me as needed.    ____________________________________________ Ihor Austin. Benjamin Stain, M.D., ABFM., CAQSM., AME. [facility]  Adjunct Professor of Family Medicine  [employer]  Restaurant manager, fast food

## 2021-12-09 NOTE — Assessment & Plan Note (Addendum)
Pleasant 61 year old female, increased urinary urgency, frequency, urinalysis positive for nitrites, leukocytes. No fevers, no costovertebral angle pain, she rarely ever gets urinary tract infection so this is uncomplicated. Adding Macrobid, Pyridium, return to see me as needed.

## 2021-12-12 LAB — URINE CULTURE
MICRO NUMBER:: 14262573
SPECIMEN QUALITY:: ADEQUATE

## 2022-02-07 ENCOUNTER — Encounter: Payer: BC Managed Care – PPO | Admitting: Medical-Surgical

## 2022-02-27 ENCOUNTER — Ambulatory Visit (INDEPENDENT_AMBULATORY_CARE_PROVIDER_SITE_OTHER): Payer: BC Managed Care – PPO | Admitting: Medical-Surgical

## 2022-02-27 ENCOUNTER — Encounter: Payer: Self-pay | Admitting: Medical-Surgical

## 2022-02-27 ENCOUNTER — Other Ambulatory Visit (HOSPITAL_COMMUNITY)
Admission: RE | Admit: 2022-02-27 | Discharge: 2022-02-27 | Disposition: A | Discharge status: Home / Self Care | Payer: BC Managed Care – PPO | Source: Ambulatory Visit | Attending: Medical-Surgical | Admitting: Medical-Surgical

## 2022-02-27 VITALS — BP 92/62 | HR 72 | Resp 17 | Ht 71.0 in | Wt 204.0 lb

## 2022-02-27 DIAGNOSIS — Z0000 Encounter for general adult medical examination without abnormal findings: Secondary | ICD-10-CM

## 2022-02-27 DIAGNOSIS — Z1211 Encounter for screening for malignant neoplasm of colon: Secondary | ICD-10-CM

## 2022-02-27 DIAGNOSIS — Z13220 Encounter for screening for lipoid disorders: Secondary | ICD-10-CM | POA: Diagnosis not present

## 2022-02-27 DIAGNOSIS — Z124 Encounter for screening for malignant neoplasm of cervix: Secondary | ICD-10-CM | POA: Diagnosis present

## 2022-02-27 DIAGNOSIS — E559 Vitamin D deficiency, unspecified: Secondary | ICD-10-CM | POA: Diagnosis not present

## 2022-02-27 DIAGNOSIS — Z1231 Encounter for screening mammogram for malignant neoplasm of breast: Secondary | ICD-10-CM

## 2022-02-27 NOTE — Progress Notes (Signed)
Complete physical exam  Patient: Sara Leblanc   DOB: Jan 06, 1961   62 y.o. Female  MRN: [id number]  Subjective:    Chief Complaint  Patient presents with   Annual Exam    Sara Leblanc is a 62 y.o. female who presents today for a complete physical exam. She reports consuming a general diet.  Doing Aquafit and Zumba with her sisters.  She generally feels well. She reports sleeping well. She does not have additional problems to discuss today.   Most recent fall risk assessment:    02/27/2022   10:03 AM  Fall Risk   Falls in the past year? 0  Number falls in past yr: 0  Injury with Fall? 0  Risk for fall due to : No Fall Risks  Follow up Falls evaluation completed     Most recent depression screenings:    02/27/2022   10:03 AM 02/03/2021    9:46 AM  PHQ 2/9 Scores  PHQ - 2 Score 0 0   Vision:Not within last year  and Dental: No current dental problems and Receives regular dental care    Patient Care Team: Samuel Bouche, NP as PCP - General (Nurse Practitioner)   Outpatient Medications Prior to Visit  Medication Sig   Ascorbic Acid (VITAMIN C) 1000 MG tablet Take 1,000 mg by mouth daily. (Patient not taking: Reported on 02/27/2022)   Calcium-Vitamin D (CALTRATE 600 PLUS-VIT D PO) Take by mouth. (Patient not taking: Reported on 02/27/2022)   Cholecalciferol (VITAMIN D3) 125 MCG (5000 UT) CAPS Take by mouth. (Patient not taking: Reported on 02/27/2022)   Cyanocobalamin (B-12) 1000 MCG CAPS Take by mouth. (Patient not taking: Reported on 02/27/2022)   meloxicam (MOBIC) 15 MG tablet Take 15 mg by mouth daily. (Patient not taking: Reported on 02/27/2022)   Multiple Vitamins-Minerals (CENTRUM SILVER 50+WOMEN PO) Take by mouth. (Patient not taking: Reported on 02/27/2022)   nitrofurantoin, macrocrystal-monohydrate, (MACROBID) 100 MG capsule Take 1 capsule (100 mg total) by mouth 2 (two) times daily. (Patient not taking: Reported on 02/27/2022)   Omega 3-6-9 Fatty Acids  (OMEGA-3-6-9 PO) Take by mouth. (Patient not taking: Reported on 02/27/2022)   scopolamine (TRANSDERM-SCOP) 1 MG/3DAYS Place 1 patch (1.5 mg total) onto the skin every 3 (three) days. (Patient not taking: Reported on 02/27/2022)   Zinc 25 MG TABS Take by mouth. (Patient not taking: Reported on 02/27/2022)   No facility-administered medications prior to visit.    Review of Systems  Constitutional:  Negative for chills, fever, malaise/fatigue and weight loss.  HENT:  Negative for congestion, ear pain, hearing loss, sinus pain and sore throat.   Eyes:  Negative for blurred vision, photophobia and pain.  Respiratory:  Negative for cough, shortness of breath and wheezing.   Cardiovascular:  Negative for chest pain, palpitations and leg swelling.  Gastrointestinal:  Negative for abdominal pain, constipation, diarrhea, heartburn, nausea and vomiting.  Genitourinary:  Negative for dysuria, frequency and urgency.  Musculoskeletal:  Positive for joint pain (bilateral foot pain). Negative for falls and neck pain.  Skin:  Negative for itching and rash.  Neurological:  Negative for dizziness, weakness and headaches.  Endo/Heme/Allergies:  Negative for polydipsia. Does not bruise/bleed easily.  Psychiatric/Behavioral:  Negative for depression, substance abuse and suicidal ideas. The patient is not nervous/anxious.      Objective:    BP 92/62 (BP Location: Left Arm, Patient Position: Sitting, Cuff Size: Normal)   Pulse 72   Resp 17   Ht 5' 11"$  (  1.803 m)   Wt 204 lb (92.5 kg)   SpO2 99%   BMI 28.45 kg/m    Physical Exam Constitutional:      General: She is not in acute distress.    Appearance: Normal appearance. She is not ill-appearing.  HENT:     Head: Normocephalic and atraumatic.     Right Ear: Tympanic membrane, ear canal and external ear normal. There is no impacted cerumen.     Left Ear: Tympanic membrane, ear canal and external ear normal. There is no impacted cerumen.     Nose: Nose  normal. No congestion or rhinorrhea.     Mouth/Throat:     Mouth: Mucous membranes are moist.     Pharynx: No oropharyngeal exudate or posterior oropharyngeal erythema.  Eyes:     General: No scleral icterus.       Right eye: No discharge.        Left eye: No discharge.     Extraocular Movements: Extraocular movements intact.     Conjunctiva/sclera: Conjunctivae normal.     Pupils: Pupils are equal, round, and reactive to light.  Neck:     Thyroid: No thyromegaly.     Vascular: No carotid bruit or JVD.     Trachea: Trachea normal.  Cardiovascular:     Rate and Rhythm: Normal rate and regular rhythm.     Pulses: Normal pulses.     Heart sounds: Normal heart sounds. No murmur heard.    No friction rub. No gallop.  Pulmonary:     Effort: Pulmonary effort is normal. No respiratory distress.     Breath sounds: Normal breath sounds. No wheezing.  Abdominal:     General: Bowel sounds are normal. There is no distension.     Palpations: Abdomen is soft.     Tenderness: There is no abdominal tenderness. There is no guarding.  Musculoskeletal:        General: Normal range of motion.     Cervical back: Normal range of motion and neck supple.  Lymphadenopathy:     Cervical: No cervical adenopathy.  Skin:    General: Skin is warm and dry.  Neurological:     Mental Status: She is alert and oriented to person, place, and time.     Cranial Nerves: No cranial nerve deficit.  Psychiatric:        Mood and Affect: Mood normal.        Behavior: Behavior normal.        Thought Content: Thought content normal.        Judgment: Judgment normal.   No results found for any visits on 02/27/22.     Assessment & Plan:    Routine Health Maintenance and Physical Exam  Immunization History  Administered Date(s) Administered   Influenza-Unspecified 11/09/2018, 10/31/2021   PFIZER(Purple Top)SARS-COV-2 Vaccination 04/04/2019, 04/23/2019   Pneumococcal Polysaccharide-23 01/21/2019   Tdap  01/10/2016   Zoster Recombinat (Shingrix) 01/21/2019    Health Maintenance  Topic Date Due   PAP SMEAR-Modifier  Never done   Zoster Vaccines- Shingrix (2 of 2) 03/13/2022 (Originally 03/18/2019)   COVID-19 Vaccine (3 - 2023-24 season) 03/15/2022 (Originally 09/09/2021)   MAMMOGRAM  05/17/2023   DTaP/Tdap/Td (2 - Td or Tdap) 01/09/2026   COLONOSCOPY (Pts 45-4yr Insurance coverage will need to be confirmed)  04/09/2030   INFLUENZA VACCINE  Completed   HPV VACCINES  Aged Out   Hepatitis C Screening  Discontinued   HIV Screening  Discontinued    Discussed  health benefits of physical activity, and encouraged her to engage in regular exercise appropriate for her age and condition.  1. Annual physical exam Checking labs as below. UTD on preventative care. Wellness information provided with AVS. - CBC with Differential/Platelet - COMPLETE METABOLIC PANEL WITH GFR - Lipid panel  2. Vitamin D deficiency Checking vitamin D today. - VITAMIN D 25 Hydroxy (Vit-D Deficiency, Fractures)  3. Lipid screening Checking lipid panel today. - Lipid panel  4. Encounter for screening mammogram for malignant neoplasm of breast Mammogram ordered. - MM DIGITAL SCREENING BILATERAL; Future  5. Cervical cancer screening Pap smear with HPV cotesting completed today. - Cytology - PAP  Return in about 1 year (around 02/28/2023) for annual physical exam.   Samuel Bouche, NP

## 2022-02-28 ENCOUNTER — Other Ambulatory Visit: Payer: Self-pay | Admitting: Medical-Surgical

## 2022-02-28 LAB — CBC WITH DIFFERENTIAL/PLATELET
Absolute Monocytes: 381 cells/uL (ref 200–950)
Basophils Absolute: 28 cells/uL (ref 0–200)
Basophils Relative: 0.6 %
Eosinophils Absolute: 71 cells/uL (ref 15–500)
Eosinophils Relative: 1.5 %
HCT: 43.4 % (ref 35.0–45.0)
Hemoglobin: 14.5 g/dL (ref 11.7–15.5)
Lymphs Abs: 1636 cells/uL (ref 850–3900)
MCH: 27.5 pg (ref 27.0–33.0)
MCHC: 33.4 g/dL (ref 32.0–36.0)
MCV: 82.2 fL (ref 80.0–100.0)
MPV: 10.5 fL (ref 7.5–12.5)
Monocytes Relative: 8.1 %
Neutro Abs: 2585 cells/uL (ref 1500–7800)
Neutrophils Relative %: 55 %
Platelets: 240 10*3/uL (ref 140–400)
RBC: 5.28 10*6/uL — ABNORMAL HIGH (ref 3.80–5.10)
RDW: 13.5 % (ref 11.0–15.0)
Total Lymphocyte: 34.8 %
WBC: 4.7 10*3/uL (ref 3.8–10.8)

## 2022-02-28 LAB — COMPLETE METABOLIC PANEL WITH GFR
AG Ratio: 1.8 (calc) (ref 1.0–2.5)
ALT: 15 U/L (ref 6–29)
AST: 15 U/L (ref 10–35)
Albumin: 4.4 g/dL (ref 3.6–5.1)
Alkaline phosphatase (APISO): 84 U/L (ref 37–153)
BUN: 14 mg/dL (ref 7–25)
CO2: 26 mmol/L (ref 20–32)
Calcium: 9.6 mg/dL (ref 8.6–10.4)
Chloride: 107 mmol/L (ref 98–110)
Creat: 0.64 mg/dL (ref 0.50–1.05)
Globulin: 2.5 g/dL (calc) (ref 1.9–3.7)
Glucose, Bld: 89 mg/dL (ref 65–99)
Potassium: 4.6 mmol/L (ref 3.5–5.3)
Sodium: 143 mmol/L (ref 135–146)
Total Bilirubin: 1.1 mg/dL (ref 0.2–1.2)
Total Protein: 6.9 g/dL (ref 6.1–8.1)
eGFR: 99 mL/min/{1.73_m2} (ref >=60)

## 2022-02-28 LAB — VITAMIN D 25 HYDROXY (VIT D DEFICIENCY, FRACTURES): Vit D, 25-Hydroxy: 21 ng/mL — ABNORMAL LOW (ref 30–100)

## 2022-02-28 LAB — LIPID PANEL
Cholesterol: 191 mg/dL (ref <200)
HDL: 60 mg/dL (ref >=50)
LDL Cholesterol (Calc): 111 mg/dL (calc) — ABNORMAL HIGH
Non-HDL Cholesterol (Calc): 131 mg/dL (calc) — ABNORMAL HIGH (ref <130)
Total CHOL/HDL Ratio: 3.2 (calc) (ref <5.0)
Triglycerides: 95 mg/dL (ref <150)

## 2022-03-03 LAB — CYTOLOGY - PAP
Comment: NEGATIVE
Diagnosis: NEGATIVE
Diagnosis: REACTIVE
High risk HPV: NEGATIVE

## 2022-12-20 ENCOUNTER — Ambulatory Visit: Payer: BC Managed Care – PPO

## 2022-12-20 DIAGNOSIS — Z1231 Encounter for screening mammogram for malignant neoplasm of breast: Secondary | ICD-10-CM | POA: Diagnosis not present

## 2023-03-01 ENCOUNTER — Encounter: Payer: Self-pay | Admitting: Medical-Surgical

## 2023-03-01 ENCOUNTER — Ambulatory Visit (INDEPENDENT_AMBULATORY_CARE_PROVIDER_SITE_OTHER): Payer: BC Managed Care – PPO | Admitting: Medical-Surgical

## 2023-03-01 VITALS — BP 113/67 | HR 74 | Resp 20 | Ht 71.0 in | Wt 207.8 lb

## 2023-03-01 DIAGNOSIS — Z0000 Encounter for general adult medical examination without abnormal findings: Secondary | ICD-10-CM

## 2023-03-01 DIAGNOSIS — E7800 Pure hypercholesterolemia, unspecified: Secondary | ICD-10-CM | POA: Diagnosis not present

## 2023-03-01 DIAGNOSIS — Z8349 Family history of other endocrine, nutritional and metabolic diseases: Secondary | ICD-10-CM | POA: Diagnosis not present

## 2023-03-01 DIAGNOSIS — E559 Vitamin D deficiency, unspecified: Secondary | ICD-10-CM | POA: Diagnosis not present

## 2023-03-01 NOTE — Patient Instructions (Signed)
 Preventive Care 16-64 Years Old, Female  Preventive care refers to lifestyle choices and visits with your health care provider that can promote health and wellness. Preventive care visits are also called wellness exams.  What can I expect for my preventive care visit?  Counseling  Your health care provider may ask you questions about your:  Medical history, including:  Past medical problems.  Family medical history.  Pregnancy history.  Current health, including:  Menstrual cycle.  Method of birth control.  Emotional well-being.  Home life and relationship well-being.  Sexual activity and sexual health.  Lifestyle, including:  Alcohol, nicotine or tobacco, and drug use.  Access to firearms.  Diet, exercise, and sleep habits.  Work and work Astronomer.  Sunscreen use.  Safety issues such as seatbelt and bike helmet use.  Physical exam  Your health care provider will check your:  Height and weight. These may be used to calculate your BMI (body mass index). BMI is a measurement that tells if you are at a healthy weight.  Waist circumference. This measures the distance around your waistline. This measurement also tells if you are at a healthy weight and may help predict your risk of certain diseases, such as type 2 diabetes and high blood pressure.  Heart rate and blood pressure.  Body temperature.  Skin for abnormal spots.  What immunizations do I need?    Vaccines are usually given at various ages, according to a schedule. Your health care provider will recommend vaccines for you based on your age, medical history, and lifestyle or other factors, such as travel or where you work.  What tests do I need?  Screening  Your health care provider may recommend screening tests for certain conditions. This may include:  Lipid and cholesterol levels.  Diabetes screening. This is done by checking your blood sugar (glucose) after you have not eaten for a while (fasting).  Pelvic exam and Pap test.  Hepatitis B test.  Hepatitis C  test.  HIV (human immunodeficiency virus) test.  STI (sexually transmitted infection) testing, if you are at risk.  Lung cancer screening.  Colorectal cancer screening.  Mammogram. Talk with your health care provider about when you should start having regular mammograms. This may depend on whether you have a family history of breast cancer.  BRCA-related cancer screening. This may be done if you have a family history of breast, ovarian, tubal, or peritoneal cancers.  Bone density scan. This is done to screen for osteoporosis.  Talk with your health care provider about your test results, treatment options, and if necessary, the need for more tests.  Follow these instructions at home:  Eating and drinking    Eat a diet that includes fresh fruits and vegetables, whole grains, lean protein, and low-fat dairy products.  Take vitamin and mineral supplements as recommended by your health care provider.  Do not drink alcohol if:  Your health care provider tells you not to drink.  You are pregnant, may be pregnant, or are planning to become pregnant.  If you drink alcohol:  Limit how much you have to 0-1 drink a day.  Know how much alcohol is in your drink. In the [place] one drink equals one 12 oz bottle of beer (355 mL), one 5 oz glass of wine (148 mL), or one 1 oz glass of hard liquor (44 mL).  Lifestyle  Brush your teeth every morning and night with fluoride toothpaste. Floss one time each day.  Exercise for at least  30 minutes 5 or more days each week.  Do not use any products that contain nicotine or tobacco. These products include cigarettes, chewing tobacco, and vaping devices, such as e-cigarettes. If you need help quitting, ask your health care provider.  Do not use drugs.  If you are sexually active, practice safe sex. Use a condom or other form of protection to prevent STIs.  If you do not wish to become pregnant, use a form of birth control. If you plan to become pregnant, see your health care provider for a  prepregnancy visit.  Take aspirin only as told by your health care provider. Make sure that you understand how much to take and what form to take. Work with your health care provider to find out whether it is safe and beneficial for you to take aspirin daily.  Find healthy ways to manage stress, such as:  Meditation, yoga, or listening to music.  Journaling.  Talking to a trusted person.  Spending time with friends and family.  Minimize exposure to UV radiation to reduce your risk of skin cancer.  Safety  Always wear your seat belt while driving or riding in a vehicle.  Do not drive:  If you have been drinking alcohol. Do not ride with someone who has been drinking.  When you are tired or distracted.  While texting.  If you have been using any mind-altering substances or drugs.  Wear a helmet and other protective equipment during sports activities.  If you have firearms in your house, make sure you follow all gun safety procedures.  Seek help if you have been physically or sexually abused.  What's next?  Visit your health care provider once a year for an annual wellness visit.  Ask your health care provider how often you should have your eyes and teeth checked.  Stay up to date on all vaccines.  This information is not intended to replace advice given to you by your health care provider. Make sure you discuss any questions you have with your health care provider.  Document Revised: 06/23/2020 Document Reviewed: 06/23/2020  Elsevier Patient Education  2024 [employer].

## 2023-03-01 NOTE — Progress Notes (Signed)
 Complete physical exam  Patient: Sara Leblanc   DOB: May 19, 1960   63 y.o. Female  MRN: [id number]  Subjective:    Chief Complaint  Patient presents with   Annual Exam    Sara Leblanc is a 63 y.o. female who presents today for a complete physical exam. She reports consuming a general diet.  Doing AquaFit and Zumba 5 days weekly.   She generally feels fairly well. She reports sleeping well. She does not have additional problems to discuss today.    Most recent fall risk assessment:    02/27/2022   10:03 AM  Fall Risk   Falls in the past year? 0  Number falls in past yr: 0  Injury with Fall? 0  Risk for fall due to : No Fall Risks  Follow up Falls evaluation completed     Most recent depression screenings:    03/01/2023   10:22 AM 02/27/2022   10:03 AM  PHQ 2/9 Scores  PHQ - 2 Score 0 0    Vision:Not within last year , Dental: No current dental problems and Receives regular dental care, and STD: The patient denies history of sexually transmitted disease.    Patient Care Team: Christen Butter, NP as PCP - General (Nurse Practitioner)   No outpatient medications prior to visit.   No facility-administered medications prior to visit.    Review of Systems  Constitutional:  Negative for chills, fever, malaise/fatigue and weight loss.  HENT:  Negative for congestion, ear pain, hearing loss, sinus pain and sore throat.   Eyes:  Negative for blurred vision, photophobia and pain.  Respiratory:  Negative for cough, shortness of breath and wheezing.   Cardiovascular:  Negative for chest pain, palpitations and leg swelling.  Gastrointestinal:  Negative for abdominal pain, constipation, diarrhea, heartburn, nausea and vomiting.  Genitourinary:  Negative for dysuria, frequency and urgency.  Musculoskeletal:  Negative for falls and neck pain.  Skin:  Negative for itching and rash.  Neurological:  Negative for dizziness, weakness and headaches.  Endo/Heme/Allergies:   Negative for polydipsia. Does not bruise/bleed easily.  Psychiatric/Behavioral:  Negative for depression, substance abuse and suicidal ideas. The patient has insomnia. The patient is not nervous/anxious.        Irritability     Objective:    BP 113/67 (BP Location: Left Arm, Cuff Size: Normal)   Pulse 74   Resp 20   Ht 5\' 11"  (1.803 m)   Wt 207 lb 12.8 oz (94.3 kg)   SpO2 97%   BMI 28.98 kg/m    Physical Exam Vitals reviewed.  Constitutional:      General: She is not in acute distress.    Appearance: Normal appearance. She is normal weight. She is not ill-appearing.  HENT:     Head: Normocephalic and atraumatic.     Right Ear: Tympanic membrane, ear canal and external ear normal. There is no impacted cerumen.     Left Ear: Tympanic membrane, ear canal and external ear normal. There is no impacted cerumen.     Nose: Nose normal. No congestion or rhinorrhea.     Mouth/Throat:     Mouth: Mucous membranes are moist.     Pharynx: No oropharyngeal exudate or posterior oropharyngeal erythema.  Eyes:     General: No scleral icterus.       Right eye: No discharge.        Left eye: No discharge.     Extraocular Movements: Extraocular movements intact.  Conjunctiva/sclera: Conjunctivae normal.     Pupils: Pupils are equal, round, and reactive to light.  Neck:     Thyroid: No thyromegaly.     Vascular: No carotid bruit or JVD.     Trachea: Trachea normal.  Cardiovascular:     Rate and Rhythm: Normal rate and regular rhythm.     Pulses: Normal pulses.     Heart sounds: Normal heart sounds. No murmur heard.    No friction rub. No gallop.  Pulmonary:     Effort: Pulmonary effort is normal. No respiratory distress.     Breath sounds: Normal breath sounds. No wheezing.  Abdominal:     General: Bowel sounds are normal. There is no distension.     Palpations: Abdomen is soft.     Tenderness: There is no abdominal tenderness. There is no guarding.  Musculoskeletal:        General:  Normal range of motion.     Cervical back: Normal range of motion and neck supple.  Lymphadenopathy:     Cervical: No cervical adenopathy.  Skin:    General: Skin is warm and dry.  Neurological:     Mental Status: She is alert and oriented to person, place, and time.     Cranial Nerves: No cranial nerve deficit.  Psychiatric:        Mood and Affect: Mood normal.        Behavior: Behavior normal.        Thought Content: Thought content normal.        Judgment: Judgment normal.      No results found for any visits on 03/01/23.     Assessment & Plan:    Routine Health Maintenance and Physical Exam  Immunization History  Administered Date(s) Administered   Influenza-Unspecified 11/09/2018, 10/31/2021   PFIZER(Purple Top)SARS-COV-2 Vaccination 04/04/2019, 04/23/2019   Pneumococcal Polysaccharide-23 01/21/2019   Tdap 01/10/2016   Zoster Recombinant(Shingrix) 01/21/2019    Health Maintenance  Topic Date Due   INFLUENZA VACCINE  04/09/2023 (Originally 08/10/2022)   Zoster Vaccines- Shingrix (2 of 2) 05/29/2023 (Originally 03/18/2019)   MAMMOGRAM  12/19/2024   DTaP/Tdap/Td (2 - Td or Tdap) 01/09/2026   Cervical Cancer Screening (HPV/Pap Cotest)  02/28/2027   Colonoscopy  04/09/2030   Pneumococcal Vaccine 53-90 Years old  Aged Out   HPV VACCINES  Aged Out   COVID-19 Vaccine  Discontinued   Hepatitis C Screening  Discontinued   HIV Screening  Discontinued    Discussed health benefits of physical activity, and encouraged her to engage in regular exercise appropriate for her age and condition.  1. Annual physical exam (Primary) Checking labs as below.  Up-to-date on preventative care.  Wellness information provided with AVS. - CBC with Differential/Platelet - CMP14+EGFR  2. Vitamin D deficiency Checking vitamin D. - VITAMIN D 25 Hydroxy (Vit-D Deficiency, Fractures)  3. Family history of B12 deficiency Checking vitamin B12. - Vitamin B12  4. Elevated LDL cholesterol  level Checking lipids. - Lipid panel   Return in about 1 year (around 02/29/2024) for annual physical exam.     Christen Butter, NP

## 2023-03-02 ENCOUNTER — Encounter: Payer: Self-pay | Admitting: Medical-Surgical

## 2023-03-02 LAB — LIPID PANEL
Chol/HDL Ratio: 3.9 {ratio} (ref 0.0–4.4)
Cholesterol, Total: 207 mg/dL — ABNORMAL HIGH (ref 100–199)
HDL: 53 mg/dL (ref >39)
LDL Chol Calc (NIH): 134 mg/dL — ABNORMAL HIGH (ref 0–99)
Triglycerides: 111 mg/dL (ref 0–149)
VLDL Cholesterol Cal: 20 mg/dL (ref 5–40)

## 2023-03-02 LAB — CMP14+EGFR
ALT: 25 [IU]/L (ref 0–32)
AST: 26 [IU]/L (ref 0–40)
Albumin: 4.4 g/dL (ref 3.9–4.9)
Alkaline Phosphatase: 98 [IU]/L (ref 44–121)
BUN/Creatinine Ratio: 15 (ref 12–28)
BUN: 10 mg/dL (ref 8–27)
Bilirubin Total: 1.3 mg/dL — ABNORMAL HIGH (ref 0.0–1.2)
CO2: 25 mmol/L (ref 20–29)
Calcium: 9.3 mg/dL (ref 8.7–10.3)
Chloride: 105 mmol/L (ref 96–106)
Creatinine, Ser: 0.67 mg/dL (ref 0.57–1.00)
Globulin, Total: 2.3 g/dL (ref 1.5–4.5)
Glucose: 83 mg/dL (ref 70–99)
Potassium: 4.4 mmol/L (ref 3.5–5.2)
Sodium: 143 mmol/L (ref 134–144)
Total Protein: 6.7 g/dL (ref 6.0–8.5)
eGFR: 98 mL/min/{1.73_m2} (ref >59)

## 2023-03-02 LAB — CBC WITH DIFFERENTIAL/PLATELET
Basophils Absolute: 0 10*3/uL (ref 0.0–0.2)
Basos: 1 %
EOS (ABSOLUTE): 0.1 10*3/uL (ref 0.0–0.4)
Eos: 2 %
Hematocrit: 44 % (ref 34.0–46.6)
Hemoglobin: 14.4 g/dL (ref 11.1–15.9)
Immature Grans (Abs): 0 10*3/uL (ref 0.0–0.1)
Immature Granulocytes: 1 %
Lymphocytes Absolute: 1.7 10*3/uL (ref 0.7–3.1)
Lymphs: 34 %
MCH: 27.4 pg (ref 26.6–33.0)
MCHC: 32.7 g/dL (ref 31.5–35.7)
MCV: 84 fL (ref 79–97)
Monocytes Absolute: 0.4 10*3/uL (ref 0.1–0.9)
Monocytes: 8 %
Neutrophils Absolute: 2.7 10*3/uL (ref 1.4–7.0)
Neutrophils: 54 %
Platelets: 236 10*3/uL (ref 150–450)
RBC: 5.26 x10E6/uL (ref 3.77–5.28)
RDW: 13.7 % (ref 11.7–15.4)
WBC: 5 10*3/uL (ref 3.4–10.8)

## 2023-03-02 LAB — VITAMIN D 25 HYDROXY (VIT D DEFICIENCY, FRACTURES): Vit D, 25-Hydroxy: 15.5 ng/mL — ABNORMAL LOW (ref 30.0–100.0)

## 2023-03-02 LAB — VITAMIN B12: Vitamin B-12: 357 pg/mL (ref 232–1245)

## 2023-03-02 MED ORDER — VITAMIN D (ERGOCALCIFEROL) 1.25 MG (50000 UNIT) PO CAPS: 50000.0000 [IU] | ORAL_CAPSULE | ORAL | 0 refills | Status: AC

## 2023-03-02 NOTE — Addendum Note (Signed)
 Addended byChristen Butter on: 03/02/2023 07:59 AM   Modules accepted: Orders

## 2023-09-11 ENCOUNTER — Encounter: Payer: Self-pay | Admitting: Sports Medicine

## 2023-09-12 IMAGING — MG DIGITAL DIAGNOSTIC BILAT W/ TOMO W/ CAD
8 series · 8 of 24 positions shown · non-contrast
Comparison: Previous exam(s).

CLINICAL DATA: Follow-up for probably benign masses in the RIGHT
breast. These probably benign masses were initially identified in
Wednesday April, 2019.

EXAM:
DIGITAL DIAGNOSTIC BILATERAL MAMMOGRAM WITH TOMOSYNTHESIS AND CAD;
ULTRASOUND RIGHT BREAST LIMITED
TECHNIQUE: Bilateral digital diagnostic mammography and breast tomosynthesis
was performed. The images were evaluated with computer-aided
detection.; Targeted ultrasound examination of the right breast was
performed

[R MLO synth-2D]
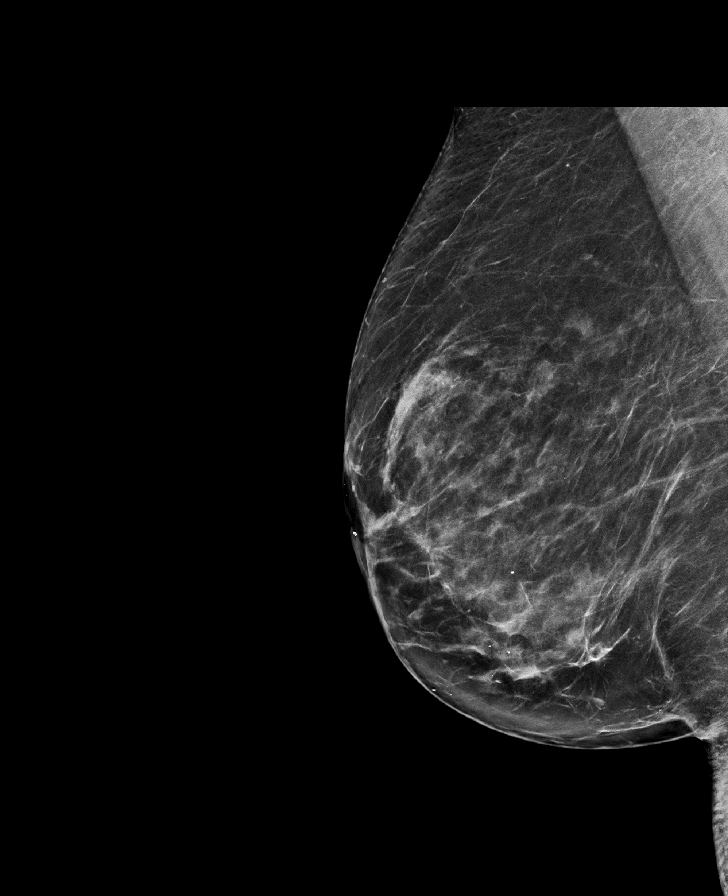

[L MLO synth-2D]
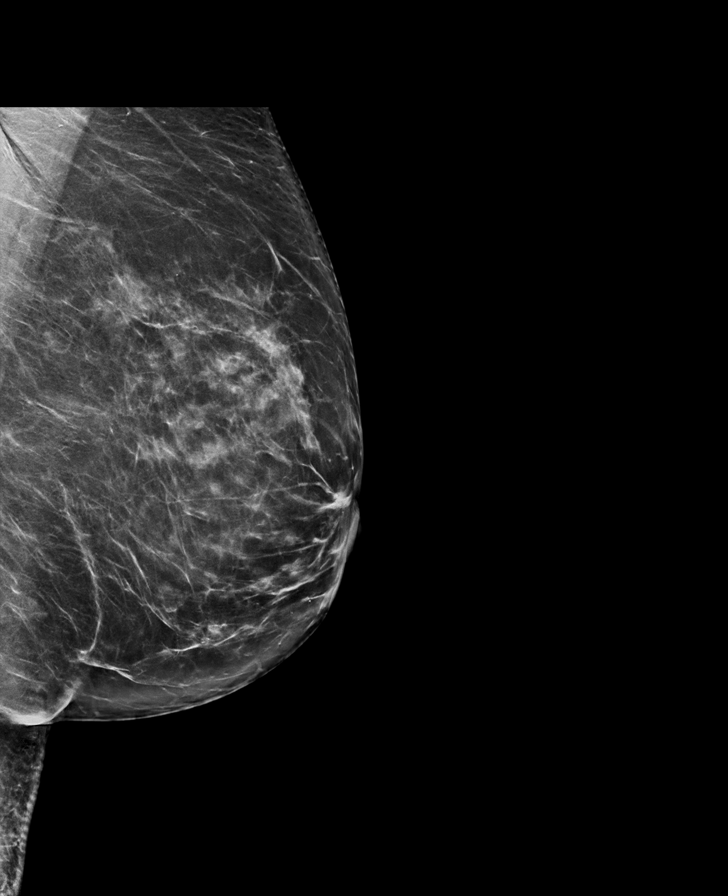

[R CC synth-2D]
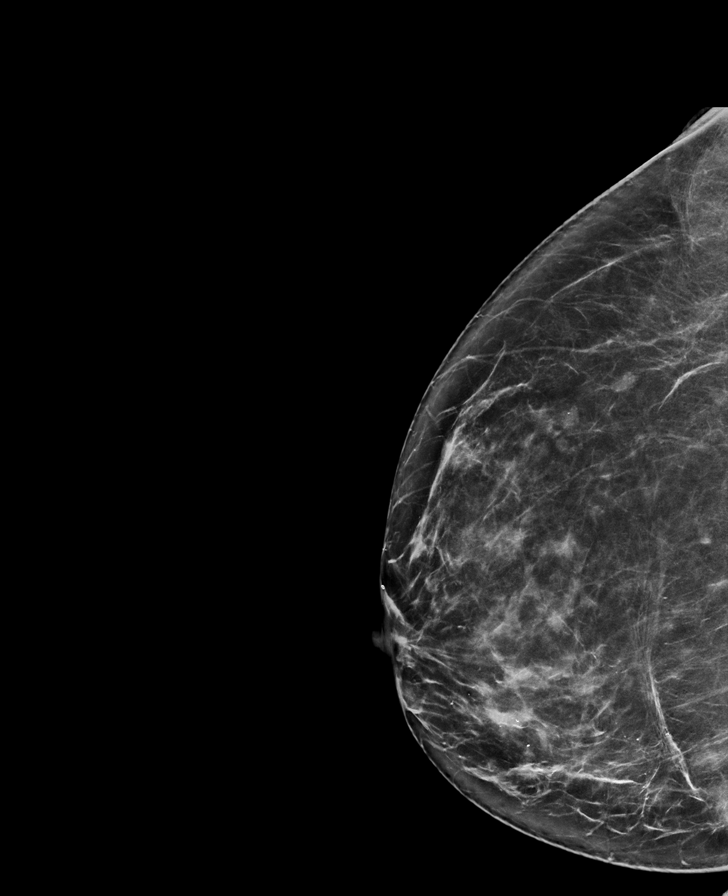

[L CC synth-2D]
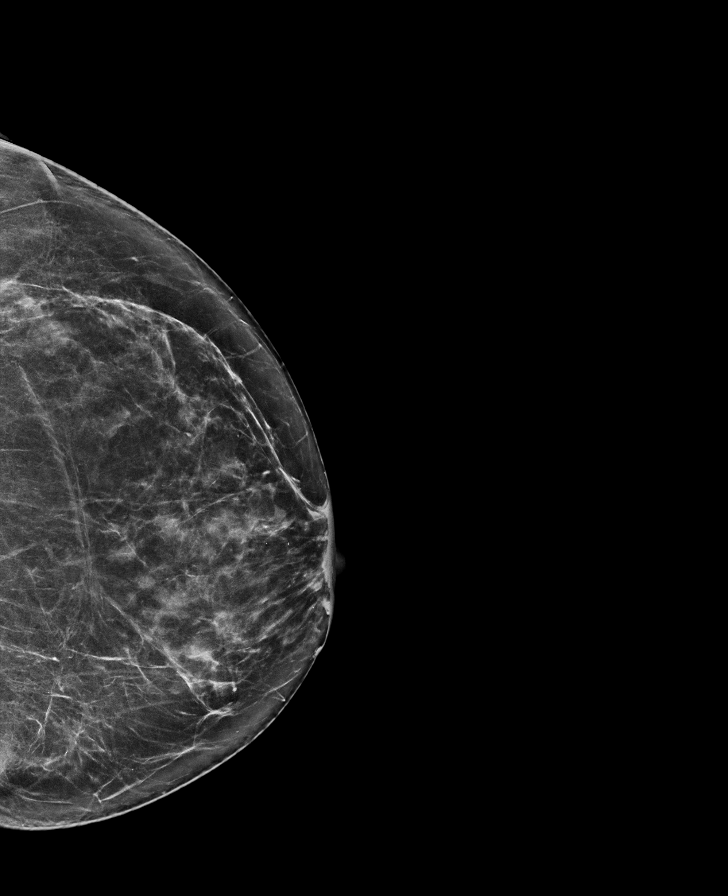

[R MLO tomo · tomo slice 39/78.0]
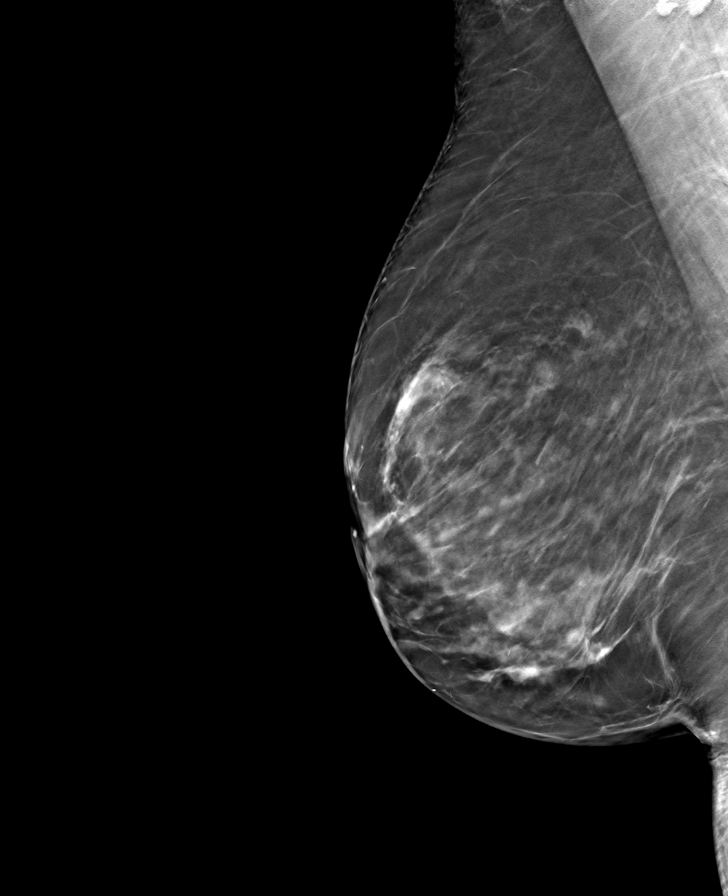

[L CC tomo · tomo slice 38/75.0]
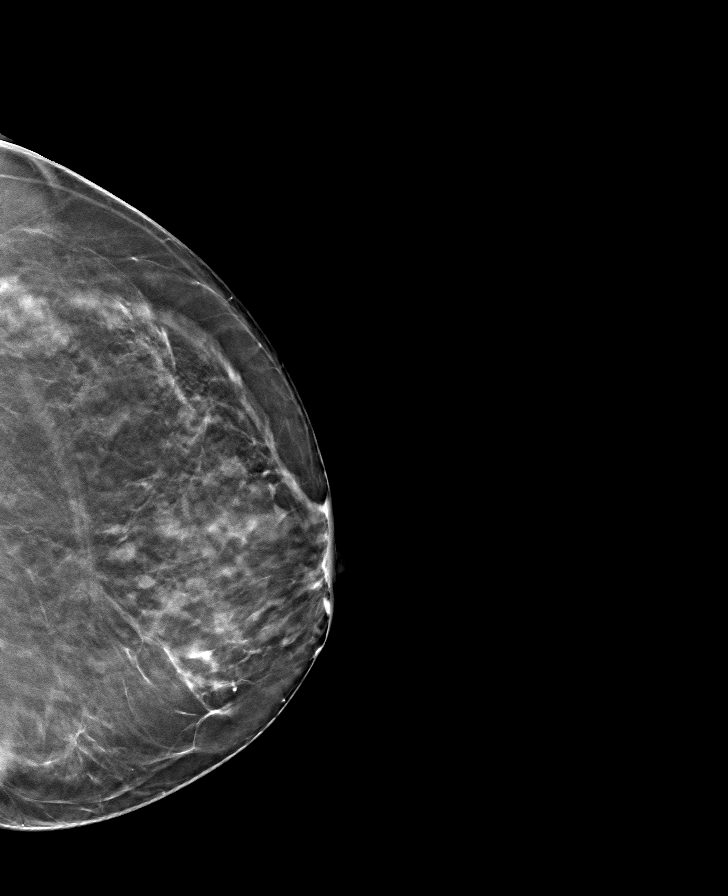

[R CC tomo · tomo slice 38/75.0]
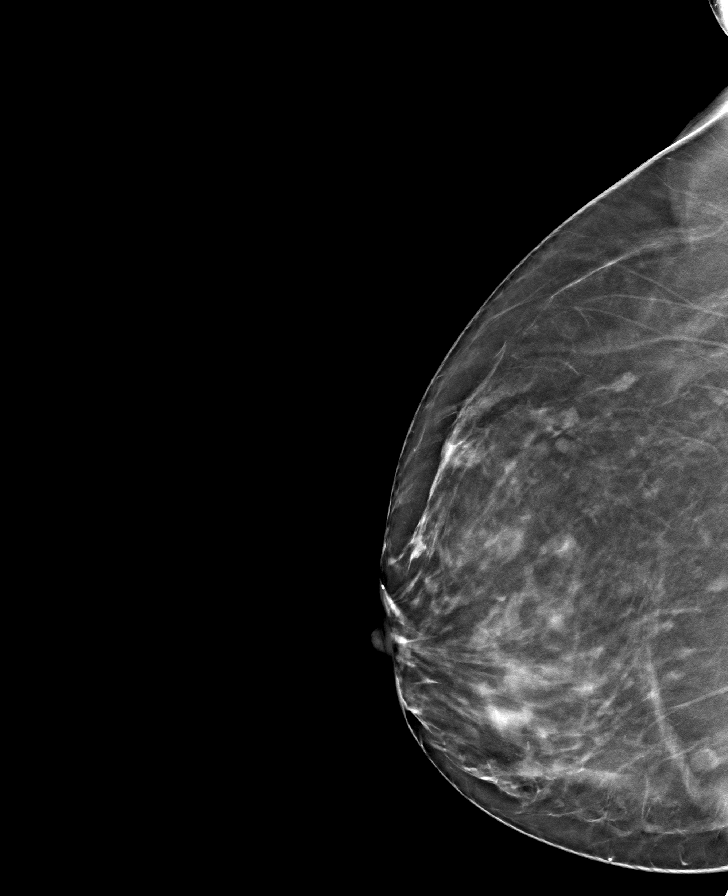

[L MLO tomo · tomo slice 40/79.0]
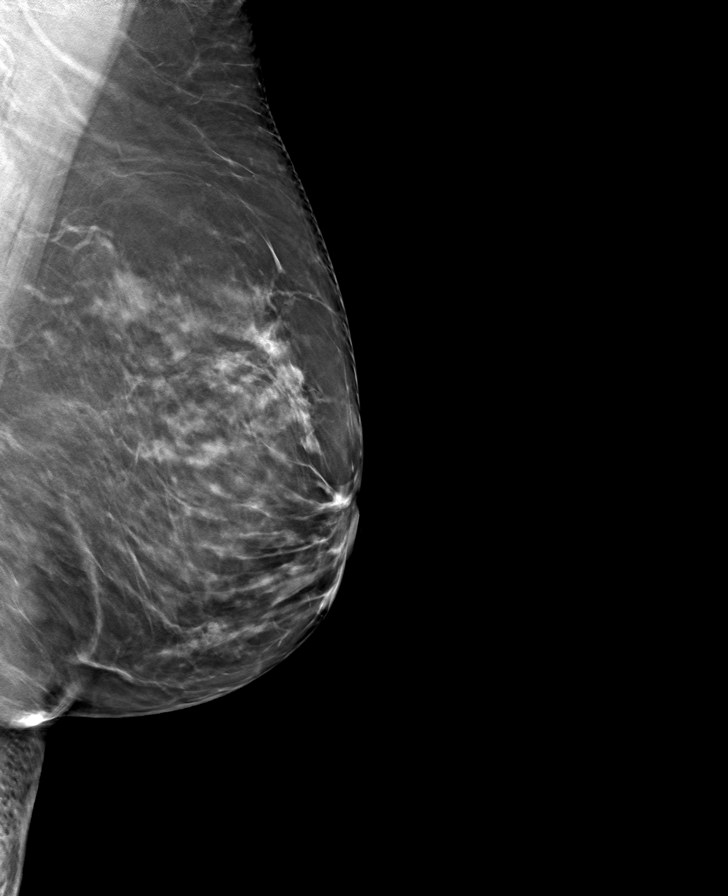

[8 of 24 positions shown; findings below may reference images not displayed]

ACR Breast Density Category c: The breast tissue is heterogeneously
dense, which may obscure small masses.
FINDINGS: The previously identified masses within the upper RIGHT breast
appear stable to slightly smaller compared to the most recent prior
mammogram of 03/09/2021, but significantly decreased in size
compared to the earlier mammograms in 1717 indicating benignity.

There are no new dominant masses, suspicious calcifications or
secondary signs of malignancy elsewhere within the RIGHT breast.

There are no new dominant masses, suspicious calcifications or
secondary signs of malignancy within the LEFT breast.

Targeted ultrasound is performed, showing an interval decrease in
size of both cysts in the RIGHT breast, 10 o'clock and 11:30 o'clock
axes respectively, compared to previous exams confirming benignity
and compatible with benign complicated cysts.
IMPRESSION: 1. No evidence of malignancy within either breast.
2. Benign complicated cysts within the RIGHT breast at the 10
o'clock and 11:30 o'clock axes, both decreased in size compared to
previous studies confirming benignity. No additional follow-up
diagnostic imaging is necessary for these benign cysts.

Patient may return to routine annual bilateral screening mammogram
schedule.

RECOMMENDATION:
Screening mammogram in one year.(Code:[id number]-KGW)

I have discussed the findings and recommendations with the patient.
If applicable, a reminder letter will be sent to the patient
regarding the next appointment.

BI-RADS CATEGORY  2: Benign.

## 2023-09-13 ENCOUNTER — Ambulatory Visit: Payer: Self-pay

## 2023-09-13 NOTE — Telephone Encounter (Signed)
 Contacted the patient for scheduling an appointment. No appointments available at [facility]. The patient was informed that [facility] has two appointments available. Due to scheduling conflict, the appointment times are not convenient for the patient. She stated she has increased her fluid intake and is drinking cranberry juice for her current symptoms. The patient is going to hold off on making an appointment at this time. The patient mentioned that if her symptoms worsen she will go to [facility] for evaluation.

## 2023-09-13 NOTE — Telephone Encounter (Signed)
 FYI Only or Action Required?: FYI only for provider.  Patient was last seen in primary care on 03/01/2023 by Willo Mini, NP.  Called Nurse Triage reporting Hematuria.  Symptoms began today.  Interventions attempted: Rest, hydration, or home remedies.  Symptoms are: gradually worsening.  Triage Disposition: See Physician Within 24 Hours  Patient/caregiver understands and will follow disposition?: Yes  Reason for Disposition  Pain or burning with passing urine  Answer Assessment - Initial Assessment Questions progressing throughout the day. No visits available in PCP office, unable to go to another location. Advised patient to UC for symptoms.  1. COLOR of URINE: Describe the color of the urine.  (e.g., tea-colored, pink, red, bloody) Do you have blood clots in your urine? (e.g., none, pea, grape, small coin)     Pinkish on toilet paper, not in urine  2. ONSET: When did the bleeding start?      Today, progressed worse  3. EPISODES: How many times has there been blood in the urine? or How many times today?     Once  4. PAIN with URINATION: Is there any pain with passing your urine? If Yes, ask: How bad is the pain?  (Scale 1-10; or mild, moderate, severe)     Burns  5. FEVER: Do you have a fever? If Yes, ask: What is your temperature, how was it measured, and when did it start?     No  6. OTHER SYMPTOMS: Do you have any other symptoms? (e.g., back/flank pain, abdomen pain, vomiting)     Constant pressure to go to the bathroom, only a little bit comes out.  Protocols used: Urine - Blood In-A-AH  Copied from CRM #[id number]. Topic: Clinical - Red Word Triage >> Sep 13, 2023  3:21 PM Kevelyn M wrote: Red Word that prompted transfer to Nurse Triage: UTI symptoms. Pressure while urinating, a little blood in urine. Just started today.

## 2024-02-14 ENCOUNTER — Other Ambulatory Visit: Payer: Self-pay | Admitting: Medical-Surgical

## 2024-02-14 DIAGNOSIS — Z1231 Encounter for screening mammogram for malignant neoplasm of breast: Secondary | ICD-10-CM

## 2024-02-29 ENCOUNTER — Encounter: Payer: BC Managed Care – PPO | Admitting: Medical-Surgical

## 2024-03-21 ENCOUNTER — Ambulatory Visit
